# Patient Record
Sex: Female | Born: 1935 | Race: White | Hispanic: No | Marital: Married
Health system: Southern US, Community
[De-identification: ages and names within clinical notes are randomized; demographics above are authoritative.]

---

## 2010-10-16 ENCOUNTER — Emergency Department: Payer: Self-pay | Admitting: Emergency Medicine

## 2013-09-20 ENCOUNTER — Emergency Department: Payer: Self-pay | Admitting: Emergency Medicine

## 2013-09-20 LAB — URINALYSIS, COMPLETE
Nitrite: NEGATIVE
Ph: 5 (ref 4.5–8.0)
Protein: NEGATIVE
Specific Gravity: 1.008 (ref 1.003–1.030)
WBC UR: 79 /HPF (ref 0–5)

## 2013-09-20 LAB — CBC
MCH: 31.8 pg (ref 26.0–34.0)
Platelet: 207 10*3/uL (ref 150–440)
WBC: 12.4 10*3/uL — ABNORMAL HIGH (ref 3.6–11.0)

## 2013-09-20 LAB — BASIC METABOLIC PANEL
BUN: 20 mg/dL — ABNORMAL HIGH (ref 7–18)
Calcium, Total: 9.5 mg/dL (ref 8.5–10.1)
Creatinine: 1.04 mg/dL (ref 0.60–1.30)
Glucose: 108 mg/dL — ABNORMAL HIGH (ref 65–99)
Potassium: 3.8 mmol/L (ref 3.5–5.1)
Sodium: 137 mmol/L (ref 136–145)

## 2013-09-20 LAB — PROTIME-INR: Prothrombin Time: 20.2 secs — ABNORMAL HIGH (ref 11.5–14.7)

## 2013-09-21 LAB — URINE CULTURE

## 2014-09-01 ENCOUNTER — Inpatient Hospital Stay: Payer: Self-pay | Admitting: Internal Medicine

## 2014-09-01 LAB — COMPREHENSIVE METABOLIC PANEL
ALK PHOS: 54 U/L
AST: 29 U/L (ref 15–37)
Albumin: 3.4 g/dL (ref 3.4–5.0)
Anion Gap: 10 (ref 7–16)
BUN: 27 mg/dL — AB (ref 7–18)
Bilirubin,Total: 0.5 mg/dL (ref 0.2–1.0)
CO2: 23 mmol/L (ref 21–32)
Calcium, Total: 8.5 mg/dL (ref 8.5–10.1)
Chloride: 107 mmol/L (ref 98–107)
Creatinine: 1.04 mg/dL (ref 0.60–1.30)
EGFR (Non-African Amer.): 48 — ABNORMAL LOW
GFR CALC AF AMER: 56 — AB
GLUCOSE: 112 mg/dL — AB (ref 65–99)
OSMOLALITY: 285 (ref 275–301)
Potassium: 3.9 mmol/L (ref 3.5–5.1)
SGPT (ALT): 18 U/L
Sodium: 140 mmol/L (ref 136–145)
Total Protein: 7.1 g/dL (ref 6.4–8.2)

## 2014-09-01 LAB — CBC
HCT: 41.3 % (ref 35.0–47.0)
HGB: 13.3 g/dL (ref 12.0–16.0)
MCH: 31 pg (ref 26.0–34.0)
MCHC: 32.3 g/dL (ref 32.0–36.0)
MCV: 96 fL (ref 80–100)
PLATELETS: 183 10*3/uL (ref 150–440)
RBC: 4.31 10*6/uL (ref 3.80–5.20)
RDW: 14.2 % (ref 11.5–14.5)
WBC: 8 10*3/uL (ref 3.6–11.0)

## 2014-09-01 LAB — PROTIME-INR
INR: 2.4
INR: 2.1
Prothrombin Time: 25.9 secs — ABNORMAL HIGH (ref 11.5–14.7)
Prothrombin Time: 23.1 secs — ABNORMAL HIGH (ref 11.5–14.7)

## 2014-09-01 LAB — HEMOGLOBIN
HGB: 11.7 g/dL — ABNORMAL LOW (ref 12.0–16.0)
HGB: 11.4 g/dL — AB (ref 12.0–16.0)
HGB: 11.4 g/dL — ABNORMAL LOW (ref 12.0–16.0)

## 2014-09-02 LAB — BASIC METABOLIC PANEL
ANION GAP: 6 — AB (ref 7–16)
BUN: 13 mg/dL (ref 7–18)
CO2: 27 mmol/L (ref 21–32)
CREATININE: 0.76 mg/dL (ref 0.60–1.30)
Calcium, Total: 8.4 mg/dL — ABNORMAL LOW (ref 8.5–10.1)
Chloride: 110 mmol/L — ABNORMAL HIGH (ref 98–107)
EGFR (Non-African Amer.): >60
Glucose: 101 mg/dL — ABNORMAL HIGH (ref 65–99)
Osmolality: 285 (ref 275–301)
Potassium: 3.2 mmol/L — ABNORMAL LOW (ref 3.5–5.1)
Sodium: 143 mmol/L (ref 136–145)

## 2014-09-02 LAB — CBC WITH DIFFERENTIAL/PLATELET
Basophil #: 0 10*3/uL (ref 0.0–0.1)
Basophil %: 0.7 %
EOS ABS: 0.1 10*3/uL (ref 0.0–0.7)
EOS PCT: 1.7 %
HCT: 34.3 % — ABNORMAL LOW (ref 35.0–47.0)
HGB: 11.4 g/dL — ABNORMAL LOW (ref 12.0–16.0)
LYMPHS ABS: 1.4 10*3/uL (ref 1.0–3.6)
LYMPHS PCT: 22.7 %
MCH: 31.7 pg (ref 26.0–34.0)
MCHC: 33.2 g/dL (ref 32.0–36.0)
MCV: 95 fL (ref 80–100)
Monocyte #: 0.7 x10 3/mm (ref 0.2–0.9)
Monocyte %: 11.2 %
Neutrophil #: 3.9 10*3/uL (ref 1.4–6.5)
Neutrophil %: 63.7 %
PLATELETS: 160 10*3/uL (ref 150–440)
RBC: 3.59 10*6/uL — AB (ref 3.80–5.20)
RDW: 14.5 % (ref 11.5–14.5)
WBC: 6.2 10*3/uL (ref 3.6–11.0)

## 2014-09-02 LAB — PROTIME-INR
INR: 1.3
Prothrombin Time: 15.7 secs — ABNORMAL HIGH (ref 11.5–14.7)

## 2014-09-02 LAB — MAGNESIUM: Magnesium: 1.5 mg/dL — ABNORMAL LOW

## 2014-09-03 LAB — CBC WITH DIFFERENTIAL/PLATELET
Basophil #: 0.1 10*3/uL (ref 0.0–0.1)
Basophil %: 0.7 %
EOS ABS: 0.1 10*3/uL (ref 0.0–0.7)
EOS PCT: 1.2 %
HCT: 37.5 % (ref 35.0–47.0)
HGB: 12.5 g/dL (ref 12.0–16.0)
Lymphocyte #: 1.9 10*3/uL (ref 1.0–3.6)
Lymphocyte %: 23 %
MCH: 32.2 pg (ref 26.0–34.0)
MCHC: 33.5 g/dL (ref 32.0–36.0)
MCV: 96 fL (ref 80–100)
MONO ABS: 1.1 x10 3/mm — AB (ref 0.2–0.9)
MONOS PCT: 12.5 %
NEUTROS PCT: 62.6 %
Neutrophil #: 5.3 10*3/uL (ref 1.4–6.5)
Platelet: 183 10*3/uL (ref 150–440)
RBC: 3.89 10*6/uL (ref 3.80–5.20)
RDW: 14.1 % (ref 11.5–14.5)
WBC: 8.4 10*3/uL (ref 3.6–11.0)

## 2014-09-03 LAB — BASIC METABOLIC PANEL
ANION GAP: 10 (ref 7–16)
BUN: 8 mg/dL (ref 7–18)
CALCIUM: 8.5 mg/dL (ref 8.5–10.1)
Chloride: 107 mmol/L (ref 98–107)
Co2: 26 mmol/L (ref 21–32)
Creatinine: 0.77 mg/dL (ref 0.60–1.30)
EGFR (African American): >60
EGFR (Non-African Amer.): >60
GLUCOSE: 98 mg/dL (ref 65–99)
OSMOLALITY: 283 (ref 275–301)
POTASSIUM: 3.7 mmol/L (ref 3.5–5.1)
Sodium: 143 mmol/L (ref 136–145)

## 2014-09-04 LAB — CBC WITH DIFFERENTIAL/PLATELET
Basophil #: 0 10*3/uL (ref 0.0–0.1)
Basophil %: 0.3 %
EOS PCT: 0.3 %
Eosinophil #: 0 10*3/uL (ref 0.0–0.7)
HCT: 35.9 % (ref 35.0–47.0)
HGB: 11.6 g/dL — ABNORMAL LOW (ref 12.0–16.0)
LYMPHS ABS: 2.3 10*3/uL (ref 1.0–3.6)
Lymphocyte %: 17.9 %
MCH: 31 pg (ref 26.0–34.0)
MCHC: 32.4 g/dL (ref 32.0–36.0)
MCV: 96 fL (ref 80–100)
MONOS PCT: 12.6 %
Monocyte #: 1.6 x10 3/mm — ABNORMAL HIGH (ref 0.2–0.9)
NEUTROS PCT: 68.9 %
Neutrophil #: 8.8 10*3/uL — ABNORMAL HIGH (ref 1.4–6.5)
Platelet: 185 10*3/uL (ref 150–440)
RBC: 3.74 10*6/uL — ABNORMAL LOW (ref 3.80–5.20)
RDW: 14 % (ref 11.5–14.5)
WBC: 12.8 10*3/uL — ABNORMAL HIGH (ref 3.6–11.0)

## 2014-09-05 LAB — CBC WITH DIFFERENTIAL/PLATELET
BASOS ABS: 0.1 10*3/uL (ref 0.0–0.1)
Basophil %: 0.8 %
EOS PCT: 0.8 %
Eosinophil #: 0.1 10*3/uL (ref 0.0–0.7)
HCT: 38 % (ref 35.0–47.0)
HGB: 12.3 g/dL (ref 12.0–16.0)
LYMPHS ABS: 2.4 10*3/uL (ref 1.0–3.6)
Lymphocyte %: 18.7 %
MCH: 31.4 pg (ref 26.0–34.0)
MCHC: 32.5 g/dL (ref 32.0–36.0)
MCV: 97 fL (ref 80–100)
MONOS PCT: 13.2 %
Monocyte #: 1.7 x10 3/mm — ABNORMAL HIGH (ref 0.2–0.9)
NEUTROS PCT: 66.5 %
Neutrophil #: 8.7 10*3/uL — ABNORMAL HIGH (ref 1.4–6.5)
PLATELETS: 180 10*3/uL (ref 150–440)
RBC: 3.94 10*6/uL (ref 3.80–5.20)
RDW: 14.3 % (ref 11.5–14.5)
WBC: 13.1 10*3/uL — AB (ref 3.6–11.0)

## 2014-09-11 ENCOUNTER — Encounter: Payer: Self-pay | Admitting: Internal Medicine

## 2014-09-11 LAB — URINALYSIS, COMPLETE
Bilirubin,UR: NEGATIVE
Glucose,UR: NEGATIVE mg/dL (ref 0–75)
KETONE: NEGATIVE
NITRITE: NEGATIVE
PH: 6 (ref 4.5–8.0)
PROTEIN: NEGATIVE
RBC,UR: 5 /HPF (ref 0–5)
Specific Gravity: 1.008 (ref 1.003–1.030)
Squamous Epithelial: 40
WBC UR: 23 /HPF (ref 0–5)

## 2014-09-12 ENCOUNTER — Emergency Department (HOSPITAL_COMMUNITY): Payer: Medicare Other

## 2014-09-12 ENCOUNTER — Inpatient Hospital Stay (HOSPITAL_COMMUNITY)
Admission: EM | Admit: 2014-09-12 | Discharge: 2014-09-20 | DRG: 064 | Disposition: E | Payer: Medicare Other | Attending: Internal Medicine | Admitting: Internal Medicine

## 2014-09-12 DIAGNOSIS — Z66 Do not resuscitate: Secondary | ICD-10-CM | POA: Diagnosis not present

## 2014-09-12 DIAGNOSIS — I4891 Unspecified atrial fibrillation: Secondary | ICD-10-CM | POA: Diagnosis present

## 2014-09-12 DIAGNOSIS — I959 Hypotension, unspecified: Secondary | ICD-10-CM | POA: Diagnosis not present

## 2014-09-12 DIAGNOSIS — J9600 Acute respiratory failure, unspecified whether with hypoxia or hypercapnia: Secondary | ICD-10-CM | POA: Diagnosis present

## 2014-09-12 DIAGNOSIS — E785 Hyperlipidemia, unspecified: Secondary | ICD-10-CM | POA: Diagnosis present

## 2014-09-12 DIAGNOSIS — I6330 Cerebral infarction due to thrombosis of unspecified cerebral artery: Secondary | ICD-10-CM | POA: Diagnosis present

## 2014-09-12 DIAGNOSIS — I10 Essential (primary) hypertension: Secondary | ICD-10-CM | POA: Diagnosis present

## 2014-09-12 DIAGNOSIS — Z79899 Other long term (current) drug therapy: Secondary | ICD-10-CM | POA: Diagnosis not present

## 2014-09-12 DIAGNOSIS — R4182 Altered mental status, unspecified: Secondary | ICD-10-CM | POA: Diagnosis present

## 2014-09-12 DIAGNOSIS — I63511 Cerebral infarction due to unspecified occlusion or stenosis of right middle cerebral artery: Secondary | ICD-10-CM

## 2014-09-12 DIAGNOSIS — Z515 Encounter for palliative care: Secondary | ICD-10-CM | POA: Diagnosis not present

## 2014-09-12 DIAGNOSIS — I6350 Cerebral infarction due to unspecified occlusion or stenosis of unspecified cerebral artery: Secondary | ICD-10-CM | POA: Diagnosis present

## 2014-09-12 DIAGNOSIS — I639 Cerebral infarction, unspecified: Secondary | ICD-10-CM | POA: Diagnosis present

## 2014-09-12 LAB — DIFFERENTIAL
BASOS ABS: 0 10*3/uL (ref 0.0–0.1)
BASOS PCT: 0 % (ref 0–1)
EOS ABS: 0.1 10*3/uL (ref 0.0–0.7)
Eosinophils Relative: 1 % (ref 0–5)
Lymphocytes Relative: 22 % (ref 12–46)
Lymphs Abs: 2.7 10*3/uL (ref 0.7–4.0)
MONOS PCT: 10 % (ref 3–12)
Monocytes Absolute: 1.3 10*3/uL — ABNORMAL HIGH (ref 0.1–1.0)
NEUTROS ABS: 8.3 10*3/uL — AB (ref 1.7–7.7)
NEUTROS PCT: 67 % (ref 43–77)

## 2014-09-12 LAB — I-STAT CHEM 8, ED
BUN: 19 mg/dL (ref 6–23)
CALCIUM ION: 1.06 mmol/L — AB (ref 1.13–1.30)
Chloride: 94 mEq/L — ABNORMAL LOW (ref 96–112)
Creatinine, Ser: 0.8 mg/dL (ref 0.50–1.10)
Glucose, Bld: 159 mg/dL — ABNORMAL HIGH (ref 70–99)
HEMATOCRIT: 42 % (ref 36.0–46.0)
Hemoglobin: 14.3 g/dL (ref 12.0–15.0)
POTASSIUM: 4.1 meq/L (ref 3.7–5.3)
SODIUM: 133 meq/L — AB (ref 137–147)
TCO2: 27 mmol/L (ref 0–100)

## 2014-09-12 LAB — CBC
HCT: 38.7 % (ref 36.0–46.0)
HEMOGLOBIN: 12.8 g/dL (ref 12.0–15.0)
MCH: 31 pg (ref 26.0–34.0)
MCHC: 33.1 g/dL (ref 30.0–36.0)
MCV: 93.7 fL (ref 78.0–100.0)
Platelets: 390 10*3/uL (ref 150–400)
RBC: 4.13 MIL/uL (ref 3.87–5.11)
RDW: 13.6 % (ref 11.5–15.5)
WBC: 12.4 10*3/uL — ABNORMAL HIGH (ref 4.0–10.5)

## 2014-09-12 LAB — COMPREHENSIVE METABOLIC PANEL
ALBUMIN: 3.3 g/dL — AB (ref 3.5–5.2)
ALK PHOS: 85 U/L (ref 39–117)
ALT: 34 U/L (ref 0–35)
ANION GAP: 18 — AB (ref 5–15)
AST: 37 U/L (ref 0–37)
BUN: 19 mg/dL (ref 6–23)
CHLORIDE: 91 meq/L — AB (ref 96–112)
CO2: 26 mEq/L (ref 19–32)
CREATININE: 0.68 mg/dL (ref 0.50–1.10)
Calcium: 9.3 mg/dL (ref 8.4–10.5)
GFR calc Af Amer: 89 mL/min — ABNORMAL LOW (ref >90)
GFR calc non Af Amer: 76 mL/min — ABNORMAL LOW (ref >90)
Glucose, Bld: 160 mg/dL — ABNORMAL HIGH (ref 70–99)
POTASSIUM: 4.2 meq/L (ref 3.7–5.3)
Sodium: 135 mEq/L — ABNORMAL LOW (ref 137–147)
Total Bilirubin: 0.6 mg/dL (ref 0.3–1.2)
Total Protein: 7.7 g/dL (ref 6.0–8.3)

## 2014-09-12 LAB — URINALYSIS, ROUTINE W REFLEX MICROSCOPIC
Bilirubin Urine: NEGATIVE
GLUCOSE, UA: NEGATIVE mg/dL
KETONES UR: NEGATIVE mg/dL
Leukocytes, UA: NEGATIVE
Nitrite: NEGATIVE
Protein, ur: 100 mg/dL — AB
Specific Gravity, Urine: 1.021 (ref 1.005–1.030)
UROBILINOGEN UA: 1 mg/dL (ref 0.0–1.0)
pH: 5 (ref 5.0–8.0)

## 2014-09-12 LAB — CBG MONITORING, ED: Glucose-Capillary: 169 mg/dL — ABNORMAL HIGH (ref 70–99)

## 2014-09-12 LAB — I-STAT ARTERIAL BLOOD GAS, ED
Acid-Base Excess: 4 mmol/L — ABNORMAL HIGH (ref 0.0–2.0)
Bicarbonate: 32.1 mEq/L — ABNORMAL HIGH (ref 20.0–24.0)
O2 Saturation: 100 %
PH ART: 7.303 — AB (ref 7.350–7.450)
TCO2: 34 mmol/L (ref 0–100)
pCO2 arterial: 64.8 mmHg (ref 35.0–45.0)
pO2, Arterial: 348 mmHg — ABNORMAL HIGH (ref 80.0–100.0)

## 2014-09-12 LAB — URINE MICROSCOPIC-ADD ON

## 2014-09-12 LAB — I-STAT TROPONIN, ED: Troponin i, poc: 0.03 ng/mL (ref 0.00–0.08)

## 2014-09-12 LAB — RAPID URINE DRUG SCREEN, HOSP PERFORMED
Amphetamines: NOT DETECTED
BENZODIAZEPINES: NOT DETECTED
Barbiturates: NOT DETECTED
Cocaine: NOT DETECTED
OPIATES: NOT DETECTED
TETRAHYDROCANNABINOL: NOT DETECTED

## 2014-09-12 LAB — PROTIME-INR
INR: 1.02 (ref 0.00–1.49)
PROTHROMBIN TIME: 13.4 s (ref 11.6–15.2)

## 2014-09-12 LAB — ETHANOL

## 2014-09-12 LAB — APTT: APTT: 29 s (ref 24–37)

## 2014-09-12 MED ORDER — MORPHINE SULFATE 10 MG/ML IJ SOLN
10.0000 mg/h | INTRAVENOUS | Status: DC
  Administered 2014-09-12 (×2): 10 mg/h via INTRAVENOUS
  Filled 2014-09-12 (×3): qty 10

## 2014-09-12 MED ORDER — SODIUM CHLORIDE 0.9 % IV SOLN
100.0000 ug/h | INTRAVENOUS | Status: DC
  Administered 2014-09-12: 100 ug/h via INTRAVENOUS
  Filled 2014-09-12: qty 50

## 2014-09-12 MED ORDER — MORPHINE BOLUS VIA INFUSION
2.0000 mg | INTRAVENOUS | Status: DC | PRN
  Administered 2014-09-12: 2 mg via INTRAVENOUS
  Filled 2014-09-12: qty 2

## 2014-09-12 MED ORDER — SODIUM CHLORIDE 0.9 % IV SOLN
2.0000 mg/h | INTRAVENOUS | Status: DC
  Filled 2014-09-12 (×2): qty 10

## 2014-09-12 MED ORDER — MORPHINE SULFATE 2 MG/ML IJ SOLN: 1.0000 mg | INTRAMUSCULAR | Status: DC | PRN

## 2014-09-12 MED ORDER — NICARDIPINE HCL IN NACL 20-0.86 MG/200ML-% IV SOLN
3.0000 mg/h | Freq: Once | INTRAVENOUS | Status: DC
Start: 2014-09-12 — End: 2014-09-12
  Filled 2014-09-12: qty 200

## 2014-09-12 MED ORDER — HALOPERIDOL LACTATE 2 MG/ML PO CONC
0.5000 mg | Freq: Four times a day (QID) | ORAL | Status: DC | PRN
  Filled 2014-09-12: qty 0.3

## 2014-09-12 MED ORDER — ATROPINE SULFATE 1 % OP SOLN
2.0000 [drp] | Freq: Three times a day (TID) | OPHTHALMIC | Status: DC
  Administered 2014-09-12: 2 [drp] via SUBLINGUAL
  Filled 2014-09-12: qty 2

## 2014-09-12 MED ORDER — ROCURONIUM BROMIDE 50 MG/5ML IV SOLN
INTRAVENOUS | Status: AC | PRN
  Administered 2014-09-12: 90 mg via INTRAVENOUS

## 2014-09-12 MED ORDER — ETOMIDATE 2 MG/ML IV SOLN
INTRAVENOUS | Status: AC | PRN
  Administered 2014-09-12: 20 mg via INTRAVENOUS

## 2014-09-12 MED ORDER — DILTIAZEM HCL 25 MG/5ML IV SOLN
10.0000 mg | Freq: Once | INTRAVENOUS | Status: AC
  Administered 2014-09-12: 10 mg via INTRAVENOUS
  Filled 2014-09-12: qty 5

## 2014-09-12 MED ORDER — LORAZEPAM 2 MG/ML PO CONC
1.0000 mg | ORAL | Status: DC | PRN
  Administered 2014-09-12: 1 mg via SUBLINGUAL
  Filled 2014-09-12: qty 0.5

## 2014-09-12 MED ORDER — ATROPINE SULFATE 1 % OP SOLN
2.0000 [drp] | OPHTHALMIC | Status: DC | PRN
  Filled 2014-09-12: qty 2

## 2014-09-12 MED ORDER — MORPHINE SULFATE 2 MG/ML IJ SOLN
1.0000 mg | INTRAMUSCULAR | Status: DC | PRN
  Administered 2014-09-12: 2 mg via INTRAVENOUS
  Filled 2014-09-12: qty 1

## 2014-09-12 MED ORDER — MORPHINE BOLUS VIA INFUSION
5.0000 mg | INTRAVENOUS | Status: DC | PRN
  Administered 2014-09-12: 15 mg via INTRAVENOUS
  Administered 2014-09-12: 20 mg via INTRAVENOUS
  Filled 2014-09-12: qty 20

## 2014-09-13 LAB — URINE CULTURE

## 2014-09-13 NOTE — Progress Notes (Signed)
UR complete.  Nevah Dalal RN, MSN 

## 2014-09-16 ENCOUNTER — Telehealth: Payer: Self-pay | Admitting: Internal Medicine

## 2014-09-16 NOTE — Discharge Summary (Signed)
  Name: Jillian Kennedy MRN: [id number] DOB: [birth year] 80 y.o.  Date of Admission: Oct 09, 2014  5:53 AM Date of Discharge: 09/16/2014 Attending Physician: No att. providers found  Discharge Diagnosis: Active Problems:   Unspecified cerebral artery occlusion with cerebral infarction   Acute respiratory failure   Atrial fibrillation   Hypertension   CVA (cerebral infarction)  Cause of death: MCA stroke Time of death: 4:55 PM  Disposition and follow-up:   Jillian Kennedy was discharged from [facility] in expired condition.    Hospital Course: Patient was admitted from nursing home with signs of stroke (confusion, nonsensical speech, left facial droop, left sided weakness) one day prior. Her coumadin had recently been held in the setting of a GI bleed 2/2 diverticulosis.   Neurology evaluated the patient. She was found to have a right MCA acute ischemic stroke (right M1 thrombus on CT and visible infarct in posterior right MCA territory, inferior division with extension into the inferior right occipital lobe, likely posterior watershed). She was outside the window for TPA.   The patient was initially intubated in the ED, as she could not protect her airway and pulmonary/critical care was consulted. However, after discussion with her family members (who had not been present during time of intubation), her code status was changed to DNR. Subsequently, the patient was extubated with the plan for comfort care.   Palliative care was also consulted. Morphine infusion-titrate for comfort and bolus doses was initiated, discontinued O2 as death was imminent and it was not contributing to her comfort, ativan was given for agitation, sublingual atropine drops were given for upper airway secretions. The chaplain visited the family.   The patient passed comfortably with family members and MDs in the room.  Signed: Dionne Ano, MD 09/16/2014, 9:53 AM

## 2014-09-20 ENCOUNTER — Encounter: Payer: Self-pay | Admitting: Internal Medicine

## 2014-09-20 NOTE — Progress Notes (Signed)
80 y.o from SNF, LSN 9/22. Per SNF staff pt had severe weakness 9/22 and report her behavior as different. No family at bedside, message left per Dr. Amada Jupiter. Pt brought in per EMS for severe weakness on lefts side, left facial droop, unintelligible speech, right gaze preference. NIHSS done yielding 29, see flowsheet for specific assessment. CBG 169. CT scan done, negative. Pt stuporous, unable to maintain her airway. Intubated per EDP at bedside. Pt out of window for acute treatment at this time. Once stable to be admitted per CCM.

## 2014-09-20 NOTE — ED Notes (Signed)
Family at bedside. 

## 2014-09-20 NOTE — Progress Notes (Signed)
Vent removed from room at this time by RT, RN at the bedside speaking with family.

## 2014-09-20 NOTE — Progress Notes (Signed)
Chaplain met with family and patient upon being paged. Pt grandson Ovid Curd, was visibly emotional and has been her primary caregiver the last three years. Chaplain offered hospitality, prayer, and grief support. Page on-call chaplain as needed.   September 25, 2014 1000  Clinical Encounter Type  Visited With Patient and family together;Health care provider  Visit Type Initial;Spiritual support  Referral From Nurse  Spiritual Encounters  Spiritual Needs Emotional;Grief support;Prayer  Stress Factors  Family Stress Factors Loss;Major life changes;Family relationships  Stamey, Barbette Hair. 10:56 AM 09/25/2014

## 2014-09-20 NOTE — ED Provider Notes (Signed)
CSN: [id number]     Arrival date & time 09/17/2014  0553 History   First MD Initiated Contact with Patient September 17, 2014 0556     No chief complaint on file.    (Consider location/radiation/quality/duration/timing/severity/associated sxs/prior Treatment) HPI  Ms. Knapper is an 80yo female with unknown history, coming from nursing home with concerns for stroke. Per EMS the patient was last seen normal at 3 AM. At that time the nursing home staff witnessed the patient having slurred speech, facial droop, and left-sided hemi-neglect. They deny any recent infections or antibiotic use. Patient is currently denying any pain. Her speech is slurred it is difficult to obtain a full history from her.   No past medical history on file. No past surgical history on file. No family history on file. History  Substance Use Topics  . Smoking status: Not on file  . Smokeless tobacco: Not on file  . Alcohol Use: Not on file   OB History   No data available     Review of Systems  Unable to perform ROS: Mental status change      Allergies  Review of patient's allergies indicates not on file.  Home Medications   Prior to Admission medications   Not on File   There were no vitals taken for this visit. Physical Exam  Nursing note and vitals reviewed. Constitutional: She appears well-developed and well-nourished. No distress.  HENT:  Head: Normocephalic and atraumatic.  Eyes: Conjunctivae and EOM are normal. Pupils are equal, round, and reactive to light. No scleral icterus.  Neck: Normal range of motion. Neck supple. No JVD present. No tracheal deviation present. No thyromegaly present.  Cardiovascular: Normal rate, regular rhythm and normal heart sounds.  Exam reveals no gallop and no friction rub.   No murmur heard. Pulmonary/Chest: Effort normal and breath sounds normal. No respiratory distress. She has no wheezes. She exhibits no tenderness.  Abdominal: Soft. Bowel sounds are normal. She  exhibits no distension and no mass. There is no tenderness. There is no rebound and no guarding.  Musculoskeletal: Normal range of motion. She exhibits no edema and no tenderness.  Lymphadenopathy:    She has no cervical adenopathy.  Neurological:  Right-sided facial droop. Right-sided gaze. Left-sided upper and lower extremity weakness. Patient can follow commands on the right side. Patient's withdrawals to pain bilaterally. Cerebellar testing was not performed due to the acuity of condition.  Skin: Skin is warm and dry. No rash noted. She is not diaphoretic. No erythema. No pallor.    ED Course  Procedures (including critical care time) Labs Review Labs Reviewed  CBC - Abnormal; Notable for the following:    WBC 12.4 (*)    All other components within normal limits  DIFFERENTIAL - Abnormal; Notable for the following:    Neutro Abs 8.3 (*)    Monocytes Absolute 1.3 (*)    All other components within normal limits  COMPREHENSIVE METABOLIC PANEL - Abnormal; Notable for the following:    Sodium 135 (*)    Chloride 91 (*)    Glucose, Bld 160 (*)    Albumin 3.3 (*)    GFR calc non Af Amer 76 (*)    GFR calc Af Amer 89 (*)    Anion gap 18 (*)    All other components within normal limits  I-STAT CHEM 8, ED - Abnormal; Notable for the following:    Sodium 133 (*)    Chloride 94 (*)    Glucose, Bld 159 (*)  Calcium, Ion 1.06 (*)    All other components within normal limits  CBG MONITORING, ED - Abnormal; Notable for the following:    Glucose-Capillary 169 (*)    All other components within normal limits  I-STAT ARTERIAL BLOOD GAS, ED - Abnormal; Notable for the following:    pH, Arterial 7.303 (*)    pCO2 arterial 64.8 (*)    pO2, Arterial 348.0 (*)    Bicarbonate 32.1 (*)    Acid-Base Excess 4.0 (*)    All other components within normal limits  ETHANOL  PROTIME-INR  APTT  URINE RAPID DRUG SCREEN (HOSP PERFORMED)  URINALYSIS, ROUTINE W REFLEX MICROSCOPIC  BLOOD GAS,  ARTERIAL  I-STAT TROPOININ, ED  I-STAT TROPOININ, ED    Imaging Review Ct Head Wo Contrast  20-Sep-2014   CLINICAL DATA:  Code stroke. Left-sided weakness and facial droop. Slurred speech.  EXAM: CT HEAD WITHOUT CONTRAST  TECHNIQUE: Contiguous axial images were obtained from the base of the skull through the vertex without intravenous contrast.  COMPARISON:  None.  FINDINGS: No acute osseous findings. There is a small incidental osteoma from the central frontal bone. Dermal inclusion cyst in the high right frontal scalp.  There is cytotoxic edema involving the right temporal operculum, posterior temporal lobe, and extending towards the inferior right occipital lobe. The visible infarct may not be third of the territory, but is still extensive. Abnormal high attenuation in the region of the right M1 segment. There may be blurring of the upper right putamen. No evidence of hemorrhage.  Mild periventricular white matter disease.  Cerebral volume loss which is age appropriate.  Critical Value/emergent results were called by telephone at the time of interpretation on 09-20-2014 at 6:23 am to Dr. Tomasita Crumble , who verbally acknowledged these results.  IMPRESSION: 1. Probable right M1 thrombus. 2. Visible infarct in posterior right MCA territory, inferior division. There is extension into the inferior right occipital lobe, likely posterior watershed.   Electronically Signed   By: Tiburcio Pea M.D.   On: 2014/09/20 06:26   Dg Chest Portable 1 View  September 20, 2014   CLINICAL DATA:  Hypoxia  EXAM: PORTABLE CHEST - 1 VIEW  COMPARISON:  September 04, 2014  FINDINGS: Endotracheal tube tip is 5.2 cm above the carina. No pneumothorax. There is mild interstitial edema. There is no airspace consolidation. Heart is mildly enlarged. The pulmonary vascularity is within normal limits. No adenopathy. There is atherosclerotic change in aorta. No bone lesions.  IMPRESSION: Endotracheal tube as described without pneumothorax.  Cardiomegaly with mild interstitial edema. Suspect a degree of congestive heart failure. No airspace consolidation.   Electronically Signed   By: Bretta Bang M.D.   On: September 20, 2014 07:03     EKG Interpretation None      MDM   Final diagnoses:  None    Patient's this emergency department out of concern for a stroke. She was immediately taken to the CT scan which revealed a right M1 thrombus. A code stroke was called, Dr. Amada Jupiter evaluated the patient at the bedside. CT is already showing significant ischemic changes. After calling the nursing home back it was discovered that her symptoms actually began during the day yesterday the patient is outside the window. Dr. Amada Jupiter does not feel any intervention can be done and recommends admission to the medical ICU. Upon returning from the CT scan patient's mental status continued to deteriorate and she was intubated for airway protection.  Subsequently patient's heart rate began to rise to the  150s systolic. She is on home diltiazem and she was given 10 IV. Upon repeat assessment her heart rate fell down to the 110s to 120s.  She was given a liter of IV fluids as well. Nicardipine gtt was ordered at the bedside as the patient was having systolic blood pressures in the 210s. Be held as well as her blood pressure remained below 180 systolic. Patient be retained in the intensive care unit for continued care.  INTUBATION Performed by: Tomasita Crumble  Required items: required blood products, implants, devices, and special equipment available Patient identity confirmed: provided demographic data and hospital-assigned identification number Time out: Immediately prior to procedure a "time out" was called to verify the correct patient, procedure, equipment, support staff and site/side marked as required.  Indications: Airway protection Intubation method: Direct Laryngoscopy   Preoxygenation: 100% BVM  Sedatives: Etomidate Paralytic:  Rocuronium   Tube Size: 8-0 cuffed  Post-procedure assessment: chest rise and ETCO2 monitor Breath sounds: equal and absent over the epigastrium Tube secured with: ETT holder Chest x-ray interpreted by radiologist and me.  Chest x-ray findings: endotracheal tube in appropriate position  Patient tolerated the procedure well with no immediate complications.  CRITICAL CARE Performed by: Tomasita Crumble   Total critical care time: 40 minutes  Critical care time was exclusive of separately billable procedures and treating other patients.  Critical care was necessary to treat or prevent imminent or life-threatening deterioration.  Critical care was time spent personally by me on the following activities: development of treatment plan with patient and/or surrogate as well as nursing, discussions with consultants, evaluation of patient's response to treatment, examination of patient, obtaining history from patient or surrogate, ordering and performing treatments and interventions, ordering and review of laboratory studies, ordering and review of radiographic studies, pulse oximetry and re-evaluation of patient's condition.       Tomasita Crumble, MD 10/02/14 [phone]

## 2014-09-20 NOTE — ED Notes (Signed)
Pt extubated at this time. Daughter remained at the bedside. Pt moaning. Suctioned oral cavity.

## 2014-09-20 NOTE — ED Notes (Signed)
Dr. Oni back at the bedside. 

## 2014-09-20 NOTE — ED Provider Notes (Signed)
0900:  PCCM Dr. Molli Knock has evaluated pt and spoken with multiple family members at the bedside: family states pt is DNR. PCCM will initiate withdrawal of care orders; requests to admit to medicine service. T/C to [facility] Resident, case discussed, including:  HPI, pertinent PM/SHx, VS/PE, dx testing, ED course and treatment:  Agreeable to admit, requests they will come to the ED for evaluation.   Samuel Jester, DO 2014/09/13 1011

## 2014-09-20 NOTE — ED Notes (Signed)
POA states he went to see her yesterday and was slurring her words.

## 2014-09-20 NOTE — Consult Note (Signed)
Neurology Consultation Reason for Consult: stroke Referring Physician: Mora Bellman, A  CC: Stroke  History is obtained from:mecdical record  HPI: Jillian Kennedy is a 80 y.o. female with a history of atrial fibrillation not on anti-coagulation due to GI bleed who presents with signs of a large right MCA infarct. She was confused throughout the day yesterday and apparently last night, her speech was nonsensical. The nurse describes that even during the day 9/22 she would reach for a cup and miss it. Overnight, she became progressively worse and her speech which was nonsense but not dysarthric initially became increasingly dysarthric sometime after 1 am.    LKW: 9/22 unclear time tpa given?: no, outside of window.    ROS: A 14 point ROS was performed and is negative except as noted in the HPI.   PMH: Afib GI bleeding Unable to obtain full due to decreased responsiveness.   Family History: Unable to assess secondary to patient's altered mental status.    Social History: Unable to assess secondary to patient's altered mental status.    Exam: Current vital signs: BP 214/99  Pulse 105  Temp(Src) 98.8 F (37.1 C) (Axillary)  Resp 15  SpO2 99% Vital signs in last 24 hours: Temp:  [98.8 F (37.1 C)] 98.8 F (37.1 C) (09/23 0609) Pulse Rate:  [56-137] 105 (09/23 0702) Resp:  [12-30] 15 (09/23 0702) BP: (138-214)/(91-155) 214/99 mmHg (09/23 0702) SpO2:  [97 %-100 %] 99 % (09/23 0702) FiO2 (%):  [100 %] 100 % (09/23 0642)  General: in bed, appears agitated CV: irregular, tachycardic Mental Status: Patient is awake but does not follow commands or answer questions. She has a clear left neglect. She is lethargic.  Cranial Nerves: II: Visual Fields are notable for right hemianopia. Pupils are equal, round, and reactive to light.  Discs are difficult to visualize. III,IV, VI: right gaze preference.  V: Facial sensation is decreased on left VII: Facial movement is left drrop VIII:  hearing is intact to voice X: Uvula elevates symmetrically XI: Shoulder shrug is symmetric. XII: tongue is midline without atrophy or fasciculations.  Motor: Tone is normal. Bulk is normal. 5/5 strength was present on the right, no movement in left arm, decreased movement left leg but does have some withdrawal Sensory: Sensation is decreased on the left.  Deep Tendon Reflexes: 2+ and symmetric in the biceps and patellae.  Cerebellar: Unable to assess secondary to patient's altered mental status.  Gait: Unable to assess secondary to patient's altered mental status.     I have reviewed labs in epic and the results pertinent to this consultation are: cmp- mildly low sodium  I have reviewed the images obtained:CT head - evolving right temporal infarct, likely left MCA occlusion  Impression: 80 yo F with LKW sometime yesterday. Her CT shows significant changes already, but I suspect that she has had either two emboli, or one embolus and then became relatively hypotensive overnight causing further ischemia from the initial lesion. She is outside of any intervention windows at this point and I have been unable to contact any family.   I am uncertain of handedness, but if she is left handed, then this could explain her language difficulty.   Recommendations: 1. HgbA1c, fasting lipid panel 2. MRI, MRA  of the brain without contrast 3. Frequent neuro checks 4. Echocardiogram 5. Carotid dopplers 6. Prophylactic therapy-Antiplatelet med: Aspirin - dose  PO or  PR 7. Risk factor modification 8. Telemetry monitoring 9. PT consult, OT consult, Speech consult  Roland Rack, MD [facility] [phone]  If 7pm- 7am, please page neurology on call as listed in [place].

## 2014-09-20 NOTE — Progress Notes (Signed)
Rate increased per abg results

## 2014-09-20 NOTE — Progress Notes (Signed)
Palliative Medicine Team  Called by RN re: uncontrolled symptoms at EOL. Primary team notified by RN but nurse received no additional orders and morphine infusion was discontinued. Nurse reports dyspnea and distress in the patient. PMT provider is not available to respond immediately and I have paged the primary service-awaiting call back. In the meantime, I have confirmed comfort is primary goal. Restarted morphine infusion-titrate for comfort and bolus doses ordered as well. Discontinued O2 at EOL since death is immanent and probably not contributing to her comfort. Ativan PRN for agitation and seizure prophylaxis in setting of stroke. Atropine SL drops for upper airway secretions.  Chaplain consult for family support.   Anderson Malta, DO Palliative Medicine [phone]

## 2014-09-20 NOTE — ED Notes (Signed)
Dr. Molli Knock at the bedside. Discussing with family pt's wishes.

## 2014-09-20 NOTE — Progress Notes (Signed)
Pt was pronounced dead by MD. Families were at the bedside and the patient was picked up by funeral home

## 2014-09-20 NOTE — ED Notes (Signed)
All family at the bedside. Pt waking up from fentanyl being stopped.

## 2014-09-20 NOTE — ED Notes (Signed)
MD at bedside. 

## 2014-09-20 NOTE — Consult Note (Signed)
Name: Jillian Kennedy MRN: [id number] DOB: 1925/11/30    ADMISSION DATE:  Sep 27, 2014 CONSULTATION DATE:  September 27, 2014  REFERRING MD :  EDP PRIMARY SERVICE:  TRH  CHIEF COMPLAINT:  CVA and VDRF  BRIEF PATIENT DESCRIPTION: 80 year old female with PMH of a-fib and HTN who started slurring her speech yesterday and was noted to have symptoms of CVA.  Has a living will that states DNR.  Was brought to ED and was intubated for airway protection.  PCCM called to admit.  Spoke with family, DNR noted.  SIGNIFICANT EVENTS / STUDIES:  9/23 VDRF due to inability to protect airway  LINES / TUBES: ETT 9/23>>>9/23  CULTURES: None  ANTIBIOTICS: None  PAST MEDICAL HISTORY :  No past medical history on file. No past surgical history on file. Prior to Admission medications   Medication Sig Start Date End Date Taking? Authorizing Provider  acetaminophen (TYLENOL) 325 MG tablet Take 650 mg by mouth every 6 (six) hours as needed for mild pain.   Yes Historical Provider, MD  acetaminophen (TYLENOL) 500 MG tablet Take 1,000 mg by mouth every 6 (six) hours as needed for mild pain.   Yes Historical Provider, MD  diltiazem (DILACOR XR) 120 MG 24 hr capsule Take 120 mg by mouth daily.   Yes Historical Provider, MD  furosemide (LASIX) 40 MG tablet Take 40 mg by mouth daily.   Yes Historical Provider, MD  Multiple Vitamin (TAB-A-VITE PO) Take 1 tablet by mouth daily.   Yes Historical Provider, MD  nystatin (MYCOSTATIN) powder Apply 1 g topically 2 (two) times daily.   Yes Historical Provider, MD  potassium chloride (KLOR-CON) 8 MEQ tablet Take 8 mEq by mouth daily.   Yes Historical Provider, MD  pravastatin (PRAVACHOL) 20 MG tablet Take 20 mg by mouth at bedtime.   Yes Historical Provider, MD   Allergies  Allergen Reactions  . Nsaids Other (See Comments)    History of GF bleed    FAMILY HISTORY:  No family history on file. SOCIAL HISTORY:  has no tobacco, alcohol, and drug history on file.  REVIEW  OF SYSTEMS:   Unattainable, patient is intubated with a large CVA  SUBJECTIVE: None  VITAL SIGNS: Temp:  [95 F (35 C)-99.9 F (37.7 C)] 99.7 F (37.6 C) (09/23 0845) Pulse Rate:  [55-175] 113 (09/23 0845) Resp:  [12-30] 20 (09/23 0845) BP: (138-214)/(60-155) 152/88 mmHg (09/23 0845) SpO2:  [97 %-100 %] 100 % (09/23 0845) FiO2 (%):  [100 %] 100 % (09/23 4098)  PHYSICAL EXAMINATION: General:  Chronically ill appearing elderly female Neuro:  Sedated and intubated HEENT:  [place]/AT, MMM Cardiovascular:  IRIR, Nl S1/S2, -M/R/G. Lungs:  Coarse BS diffusely. Abdomen:  Soft, NT, ND and +BS. Musculoskeletal:  -edema and -tenderness. Skin:  Intact   Recent Labs Lab 09/27/2014 0603 09-27-2014 0608  NA 135* 133*  K 4.2 4.1  CL 91* 94*  CO2 26  --   BUN 19 19  CREATININE 0.68 0.80  GLUCOSE 160* 159*    Recent Labs Lab 09-27-14 0603 2014/09/27 0608  HGB 12.8 14.3  HCT 38.7 42.0  WBC 12.4*  --   PLT 390  --    Ct Head Wo Contrast  Sep 27, 2014   CLINICAL DATA:  Code stroke. Left-sided weakness and facial droop. Slurred speech.  EXAM: CT HEAD WITHOUT CONTRAST  TECHNIQUE: Contiguous axial images were obtained from the base of the skull through the vertex without intravenous contrast.  COMPARISON:  None.  FINDINGS: No  acute osseous findings. There is a small incidental osteoma from the central frontal bone. Dermal inclusion cyst in the high right frontal scalp.  There is cytotoxic edema involving the right temporal operculum, posterior temporal lobe, and extending towards the inferior right occipital lobe. The visible infarct may not be third of the territory, but is still extensive. Abnormal high attenuation in the region of the right M1 segment. There may be blurring of the upper right putamen. No evidence of hemorrhage.  Mild periventricular white matter disease.  Cerebral volume loss which is age appropriate.  Critical Value/emergent results were called by telephone at the time of  interpretation on 09-18-2014 at 6:23 am to Dr. Tomasita Crumble , who verbally acknowledged these results.  IMPRESSION: 1. Probable right M1 thrombus. 2. Visible infarct in posterior right MCA territory, inferior division. There is extension into the inferior right occipital lobe, likely posterior watershed.   Electronically Signed   By: Tiburcio Pea M.D.   On: 09/18/2014 06:26   Dg Chest Portable 1 View  09-18-14   CLINICAL DATA:  Hypoxia  EXAM: PORTABLE CHEST - 1 VIEW  COMPARISON:  September 04, 2014  FINDINGS: Endotracheal tube tip is 5.2 cm above the carina. No pneumothorax. There is mild interstitial edema. There is no airspace consolidation. Heart is mildly enlarged. The pulmonary vascularity is within normal limits. No adenopathy. There is atherosclerotic change in aorta. No bone lesions.  IMPRESSION: Endotracheal tube as described without pneumothorax. Cardiomegaly with mild interstitial edema. Suspect a degree of congestive heart failure. No airspace consolidation.   Electronically Signed   By: Bretta Bang M.D.   On: 09-18-2014 07:03    ASSESSMENT / PLAN:  80 year old female with history of a-fib who presents with a massive CVA, intubated for airway protection, DNR, family waiting for arrival of grandson then will be ready to extubate.  Will make DNR and leave withdrawal orders in.  Once family arrive then extubate and TRH to admit to floor for palliation.  PCCM will sign off, please call back if needed.  CC time 35 min.  Alyson Reedy, M.D. [facility] Pulmonary/Critical Care Medicine. Pager: [phone]. After hours pager: [phone].  18-Sep-2014, 9:01 AM

## 2014-09-20 NOTE — ED Notes (Addendum)
Per EMS: pt coming from [facility] skilled nursing facility with c/o AMS since 0300 per staff. Pt presents with left sided deficits, non verbal, alert and disoriented x 4. CBG 167

## 2014-09-20 NOTE — ED Notes (Signed)
Oxygen at 2 liters [place] applied to pt for comfort.

## 2014-09-20 NOTE — Progress Notes (Signed)
Note plans for comfort care. Stroke Service will sign off. Please call for any needs.  Annie Main, MSN, RN, ANVP-BC, ANP-BC, [facility] Pager: [phone] 25-Sep-2014 11:38 AM

## 2014-09-20 NOTE — ED Notes (Signed)
hospitalist at the bedside 

## 2014-09-20 NOTE — H&P (Signed)
Date: 2014-09-14               Patient Name:  Jillian Kennedy MRN: [id number]  DOB: [birth year] Age / Sex: 80 y.o., female   PCP: Lauro Regulus, MD              Medical Service: Internal Medicine Teaching Service              Attending Physician: Dr. Tomasita Crumble, MD    First Contact: Dr. Leatha Gilding  Pager: [phone]  Second Contact: Dr. Sherrine Maples Pager: [phone]            After Hours (After 5p/  First Contact Pager: [phone]  weekends / holidays): Second Contact Pager: [phone]   Chief Complaint:  Acute stroke   History of Present Illness: this is an 80 year old woman, with past medical history of atrial fibrillation, recently off her Coumadin, hypertension, hyperlipidemia, who presents from a nursing home with signs of stroke since yesterday. History is obtained from the family due to patient's status. She was confused throughout the day yesterday and apparently last night, her speech was nonsensical. Overnight, she became progressively worse and her speech became increasingly dysarthric. She was also noted to have left facial droop and left sided weakness sometime around 3 AM on the morning of admission. On 09/06/2014 she was discharged to a nursing home after a brief hospitalization at [facility] for a diverticular bleed. During that admission her Coumadin therapy of 15 years was held. No report of rectal bleeding since discharge.  The patient was briefly intubated in the emergency department for airway protection, pulmonary and critical care service was consulted. After discussion with family members, who were not present at the time of intubation, her CODE STATUS was changed to DO NOT RESUSCITATE. Subsequently, patient was extubated with plan to consider comfort care. IMTS requested to admit the patient.   Review of Systems: Unable to obtain a review of systems due to patient's condition. At family member reported that have not seen rectal bleeding and no recent falls. No  fevers, chills, or vomiting. She was able to swallow water and eat food until yesterday.   Meds: Current Facility-Administered Medications  Medication Dose Route Frequency Provider Last Rate Last Dose  . fentaNYL (SUBLIMAZE) 2,500 mcg in sodium chloride 0.9 % 250 mL (10 mcg/mL) infusion  100 mcg/hr Intravenous Continuous Tomasita Crumble, MD   100 mcg/hr at September 14, 2014 0723  . morphine 100 mg in dextrose 5 % 100 mL (1 mg/mL) infusion  10 mg/hr Intravenous Continuous Alyson Reedy, MD      . morphine bolus via infusion 5-20 mg  5-20 mg Intravenous Q15 min PRN Alyson Reedy, MD      . nicardipine (CARDENE)  in 0.86% saline IV infusion (0.1 mg/ml)  3-15 mg/hr Intravenous Once Tomasita Crumble, MD       Current Outpatient Prescriptions  Medication Sig Dispense Refill  . acetaminophen (TYLENOL) 325 MG tablet Take 650 mg by mouth every 6 (six) hours as needed for mild pain.      Marland Kitchen acetaminophen (TYLENOL) 500 MG tablet Take 1,000 mg by mouth every 6 (six) hours as needed for mild pain.      Marland Kitchen diltiazem (DILACOR XR) 120 MG 24 hr capsule Take 120 mg by mouth daily.      . furosemide (LASIX) 40 MG tablet Take 40 mg by mouth daily.      . Multiple Vitamin (TAB-A-VITE PO) Take 1 tablet by mouth daily.      Marland Kitchen  nystatin (MYCOSTATIN) powder Apply 1 g topically 2 (two) times daily.      . potassium chloride (KLOR-CON) 8 MEQ tablet Take 8 mEq by mouth daily.      . pravastatin (PRAVACHOL) 20 MG tablet Take 20 mg by mouth at bedtime.        Allergies: Allergies as of September 20, 2014 - Review Complete 09/20/2014  Allergen Reaction Noted  . Nsaids Other (See Comments) 09/20/2014   No past medical history on file. No past surgical history on file. No family history on file. History   Social History  . Marital Status: Married    Spouse Name: N/A    Number of Children: N/A  . Years of Education: N/A   Occupational History  . Not on file.   Social History Main Topics  . Smoking status: Not on file  .  Smokeless tobacco: Not on file  . Alcohol Use: Not on file  . Drug Use: Not on file  . Sexual Activity: Not on file   Other Topics Concern  . Not on file   Social History Narrative  . No narrative on file      Physical Exam: Blood pressure 152/88, pulse 113, temperature 99.7 F (37.6 C), temperature source Axillary, resp. rate 20, SpO2 100.00%.  General:  acutely ill with moderate distress Head: atraumatic, normocephalic,  Eye: pupils equal, round and reactive; sclera anicteric; normal conjunctiva  Neck: supple, Lungs/Chest wall: Agonal breathing, with respiratory pauses noted. Otherwise clear to auscultation bilaterally, normal work of breathing  Heart: , tachycardia, with a regular rate and rhythm; no murmurs Pulses: radial and dorsalis pedis pulses are 2+ and symmetric  Abdomen: Normal fullness, no rebound, guarding, or rigidity; normal bowel sounds; no masses or organomegaly  Skin: warm, dry, intact, normal turgor, no rashes  Extremities: no peripheral edema, clubbing, or cyanosis Neurologic:  patient unable to participate in a full neurological exam.   Motor: Tone is normal. Bulk is normal. Able to move right arm, no movement in left arm, and appears flaccid, decreased movement left leg. Sensory: No tested, but previously noted by neurology as reduced on the left side. Deep Tendon Reflexes: 2+ and symmetric in the biceps and patellae.  Lab results: Basic Metabolic Panel:  Recent Labs  96/29/52 0603 September 20, 2014 0608  NA 135* 133*  K 4.2 4.1  CL 91* 94*  CO2 26  --   GLUCOSE 160* 159*  BUN 19 19  CREATININE 0.68 0.80  CALCIUM 9.3  --    Liver Function Tests:  Recent Labs  09/20/14 0603  AST 37  ALT 34  ALKPHOS 85  BILITOT 0.6  PROT 7.7  ALBUMIN 3.3*  CBC:  Recent Labs  09-20-14 0603 Sep 20, 2014 0608  WBC 12.4*  --   NEUTROABS 8.3*  --   HGB 12.8 14.3  HCT 38.7 42.0  MCV 93.7  --   PLT 390  --    CBG:  Recent Labs  09-20-2014 0611  GLUCAP 169*    Coagulation:  Recent Labs  2014-09-20 0603  LABPROT 13.4  INR 1.02   Urine Drug Screen: Drugs of Abuse     Component Value Date/Time   LABOPIA NONE DETECTED 09-20-14 0751   COCAINSCRNUR NONE DETECTED 09-20-14 0751   LABBENZ NONE DETECTED 09-20-14 0751   AMPHETMU NONE DETECTED Sep 20, 2014 0751   THCU NONE DETECTED 2014-09-20 0751   LABBARB NONE DETECTED 09-20-2014 0751    Alcohol Level:  Recent Labs  09-20-14 0603  ETH <11  Urinalysis:  Recent Labs  2014-10-05 0751  COLORURINE AMBER*  LABSPEC 1.021  PHURINE 5.0  GLUCOSEU NEGATIVE  HGBUR MODERATE*  BILIRUBINUR NEGATIVE  KETONESUR NEGATIVE  PROTEINUR 100*  UROBILINOGEN 1.0  NITRITE NEGATIVE  LEUKOCYTESUR NEGATIVE    Imaging results:  Ct Head Wo Contrast  10-05-14   CLINICAL DATA:  Code stroke. Left-sided weakness and facial droop. Slurred speech.  EXAM: CT HEAD WITHOUT CONTRAST  TECHNIQUE: Contiguous axial images were obtained from the base of the skull through the vertex without intravenous contrast.  COMPARISON:  None.  FINDINGS: No acute osseous findings. There is a small incidental osteoma from the central frontal bone. Dermal inclusion cyst in the high right frontal scalp.  There is cytotoxic edema involving the right temporal operculum, posterior temporal lobe, and extending towards the inferior right occipital lobe. The visible infarct may not be third of the territory, but is still extensive. Abnormal high attenuation in the region of the right M1 segment. There may be blurring of the upper right putamen. No evidence of hemorrhage.  Mild periventricular white matter disease.  Cerebral volume loss which is age appropriate.  Critical Value/emergent results were called by telephone at the time of interpretation on Oct 05, 2014 at 6:23 am to Dr. Tomasita Crumble , who verbally acknowledged these results.  IMPRESSION: 1. Probable right M1 thrombus. 2. Visible infarct in posterior right MCA territory, inferior division. There  is extension into the inferior right occipital lobe, likely posterior watershed.   Electronically Signed   By: Tiburcio Pea M.D.   On: October 05, 2014 06:26   Dg Chest Portable 1 View  10-05-14   CLINICAL DATA:  Hypoxia  EXAM: PORTABLE CHEST - 1 VIEW  COMPARISON:  September 04, 2014  FINDINGS: Endotracheal tube tip is 5.2 cm above the carina. No pneumothorax. There is mild interstitial edema. There is no airspace consolidation. Heart is mildly enlarged. The pulmonary vascularity is within normal limits. No adenopathy. There is atherosclerotic change in aorta. No bone lesions.  IMPRESSION: Endotracheal tube as described without pneumothorax. Cardiomegaly with mild interstitial edema. Suspect a degree of congestive heart failure. No airspace consolidation.   Electronically Signed   By: Bretta Bang M.D.   On: 10-05-2014 07:03    Other results: EKG: atrial fibrillation, rate 126, no acute signs of ischemia by ST segment elevations or T wave changes.  Assessment & Plan by Problem: Active Problems:   Unspecified cerebral artery occlusion with cerebral infarction   Acute respiratory failure   Atrial fibrillation   Hypertension   CVA (cerebral infarction)  Right MCA Acute Ischemic Stroke: presents with signs of a large right MCA infarct. Head CT scan reveals probable right M1 thrombus, and visible infarct in the posterior right MCA territory, inferior division with extension into the inferior right occipital lobe, likely posterior watershed. Patient was on anticoagulation until last week when the Coumadin was stopped to due to GI bleed in the setting of diverticulosis. She is outside the TPA window. Has been evaluated by neurology. Plan. - Will admit patient to a MedSurg bed - Discussed with the family about poor prognosis with a massive MCA, They are leaning toward comfort care at this time. - Consult palliative care for goal of care  - No feeding tube per the patient's family - Sublingual  Ativan 1 mg q2hrs prn for agitation - haloperidol, sublingual 0.5 mg every 6hr prn for agitation  - IV morphine 1-2 mg q2hrs prn for signs of pain and respiratory distress - Neurology has  signed off since patient is now comfort care  - Chaplain present to provide spiritual care   Acute respiratory failure: This is related to acute CVA. Briefly intubated in the ED for airway protection, prior to arrival of her family, who confirmed DO NOT RESUSCITATE status, and the patient subsequently extubated. Plan. -Currently DNR - Will use morphine for pain no respiratory distress - Will acquire frequent suctioning  Atrial Fibrillation with RVR:  EKG, with evidence of RVR. Off anticoagulation with recent GI bleed. In the ED, received Cardizem, which has since been discontinued. Plan. -May try IV metoprolol, for heart rate > 140 beats/min -No anticoagulation in the setting of acute CVA   Recent Diverticular Bleed: The patient's family, she was recently admitted at [facility], where she suffered a GI bleed from diverticulosis. She was taken off. Her Coumadin. She has not had any GI bleed episode since returned to the nursing home. Hemoglobin is 12.8. Plan. -Will continue to monitor clinically.   Hypertension: Hold home antihypertensive medications, including   furosemide 40 mg daily, Diltiazem 120 mg daily.   Code Status: DNR  F/E/N: NPO  Family communication:  plan of care has been communicated to the family including patient's HPOA, Jillian Kennedy [phone]), patient's daughter Jillian Kennedy [phone]) and patient's grandson Jillian Kennedy [phone]). They were all present at bedside and agree to comfort care at this time. Will have palliative care see patient as well.   VTE Ppx: None  Dispo: Disposition is deferred at this time, awaiting improvement of current medical problems. Anticipated discharge in approximately 2-3 day(s).   The patient does have a current PCP Lauro Regulus,  MD), therefore will be require OPC follow-up after discharge.   The patient does not have transportation limitations that hinder transportation to clinic appointments.   Signed:  Dow Adolph, MD PGY-3 Internal Medicine Teaching Service Pager: [phone] (7pm-7am) 09/28/14, 9:48 AM

## 2014-09-20 NOTE — ED Notes (Signed)
Chaplain called for the family

## 2014-09-20 NOTE — Procedures (Signed)
Intubation Procedure Note Jillian Kennedy [id number] 1925/11/08  Procedure: Intubation Indications: Airway protection and maintenance  Procedure Details Consent: Unable to obtain consent because of altered level of consciousness. Time Out: Verified patient identification, verified procedure, site/side was marked, verified correct patient position, special equipment/implants available, medications/allergies/relevent history reviewed, required imaging and test results available.  Performed  Maximum sterile technique was used including antiseptics, gloves and hand hygiene.  MAC and 3    Evaluation Hemodynamic Status: BP stable throughout; O2 sats: stable throughout Patient's Current Condition: stable Complications: No apparent complications Patient did tolerate procedure well. Chest X-ray ordered to verify placement.  CXR: tube position acceptable.   Inez Pilgrim 2014-09-23

## 2014-09-20 NOTE — Progress Notes (Signed)
Pt was on iv morphine drip before passing, wasted about  ( ) in the pyxis room sinc at 2200,verified by a second RN Messan Tepe . Obasogie-Asidi, Geneva Barrero Efe

## 2014-09-20 NOTE — ED Notes (Signed)
Dr. yacoub at the bedside.  

## 2014-09-20 DEATH — deceased

## 2015-04-13 NOTE — Discharge Summary (Signed)
Dates of Admission and Diagnosis:  Date of Admission 01-Sep-2014   Date of Discharge 05-Sep-2014   Admitting Diagnosis GI bleed   Final Diagnosis Lower GI bleed, diverticulosis, chronic atrial fibrillation   Discharge Diagnosis 1 Lower GI bleed, diverticulosis   2 Chronic atrial fibrillation   3 Moderate aortic stenosis   4 Hypertension   5 Hyperlipidemia    Chief Complaint/History of Present Illness 81 year old lady with h/o hypertension, moderate aortic stenosis and chronic atrial fibrillation on chronic coumadin therapy presented with bright red bleeding per rectum.   Allergies:  NSAIDS: Other    Hepatic:  12-Sep-15 04:23   Bilirubin, Total 0.5  Alkaline Phosphatase 54 (46-116 NOTE: New Reference Range 07/10/14)  SGPT (ALT) 18 (14-63 NOTE: New Reference Range 07/10/14)  SGOT (AST) 29  Total Protein, Serum 7.1  Albumin, Serum 3.4  Routine BB:  12-Sep-15 04:23   ABO Group + Rh Type O Positive  Antibody Screen NEGATIVE (Result(s) reported on 01 Sep 2014 at 05:08AM.)  Routine Chem:  12-Sep-15 04:23   Glucose, Serum  112  BUN  27  Creatinine (comp) 1.04  Sodium, Serum 140  Potassium, Serum 3.9  Chloride, Serum 107  CO2, Serum 23  Calcium (Total), Serum 8.5  Anion Gap 10  Osmolality (calc) 285  eGFR (African American)  56  eGFR (Non-African American)  48 (eGFR values <36m/min/1.73 m2 may be an indication of chronic kidney disease (CKD). Calculated eGFR is useful in patients with stable renal function. The eGFR calculation will not be reliable in acutely ill patients when serum creatinine is changing rapidly. It is not useful in  patients on dialysis. The eGFR calculation may not be applicable to patients at the low and high extremes of body sizes, pregnant women, and vegetarians.)  Routine Coag:  12-Sep-15 04:23   Prothrombin  25.9  INR 2.4 (INR reference interval applies to patients on anticoagulant therapy. A single INR therapeutic range for  coumarins is not optimal for all indications; however, the suggested range for most indications is 2.0 - 3.0. Exceptions to the INR Reference Range may include: Prosthetic heart valves, acute myocardial infarction, prevention of myocardial infarction, and combinations of aspirin and anticoagulant. The need for a higher or lower target INR must be assessed individually. Reference: The Pharmacology and Management of the Vitamin K  antagonists: the seventh ACCP Conference on Antithrombotic and Thrombolytic Therapy. CYSAYT.0160Sept:126 (3suppl): [id number] A HCT value >55% may artifactually increase the PT.  In one study,  the increase was an average of 25%. Reference:  "Effect on Routine and Special Coagulation Testing Values of Citrate Anticoagulant Adjustment in Patients with High HCT Values." American Journal of Clinical Pathology 2006;126:400-405.)  Routine Hem:  12-Sep-15 04:23   WBC (CBC) 8.0  RBC (CBC) 4.31  Hemoglobin (CBC) 13.3  Hematocrit (CBC) 41.3  Platelet Count (CBC) 183 (Result(s) reported on 01 Sep 2014 at 04:33AM.)  MCV 96  MCH 31.0  MCHC 32.3  RDW 14.2    08:20   Hemoglobin (CBC)  11.7 (Result(s) reported on 01 Sep 2014 at 08:38AM.)    14:37   Hemoglobin (CBC)  11.4 (Result(s) reported on 01 Sep 2014 at 02:52PM.)    19:48   Hemoglobin (CBC)  11.4 (Result(s) reported on 01 Sep 2014 at 08:01PM.)  13-Sep-15 03:28   WBC (CBC) 6.2  Hemoglobin (CBC)  11.4  14-Sep-15 03:08   WBC (CBC) 8.4  Hemoglobin (CBC) 12.5  15-Sep-15 05:41   WBC (CBC)  12.8  Hemoglobin (CBC)  11.6  16-Sep-15 12:08   WBC (CBC)  13.1  Hemoglobin (CBC) 12.3   Pertinent Past History:  Pertinent Past History Chronic atrial fibrillation, Moderate aortic stenosis Hypertension Hyperlipidemia   Hospital Course:  Hospital Course 81 year old lady with h/o hypertension, moderate aortic stenosis and chronic atrial fibrillation on chronic coumadin therapy  admitted with lower GI bleed. INR was 2.4  on initial labs. Anti-coagulation was reversed with vitamin K and FFP. Patient received IV fluids and supportive care. She was kept NPO. H/H remained stable. Tagged RBC scan was negative. Patient was evaluated by GI. Colonoscopy revealed diverticulosis, otherwise negative. Chronic atrial fibrillation remained rate controlled with oral Cardizem ER. Coumadin remained on hold for GI bleed. Hypertension was controlled with Cardizem ER. Pravastatin was continued for hyperlipidemia. I discussed the plan for anticoagulation with Dr. Clayborn Bigness. Cardiology recommendation was to hold Coumadin for 2-4 weeks until her cardiology follow up as out patient. No aspirin recommended at this time. Mild leukocytosis WBC 13.1, no left shift, CXR ? CHF vs atypical pneumonia. Shall start Z pak for antibiotic coverage. Plan is to discharge to [facility] today.   Condition on Discharge Guarded   DISCHARGE INSTRUCTIONS HOME MEDS:  Medication Reconciliation: Patient's Home Medications at Discharge:     Medication Instructions  pravastatin 20 mg oral tablet  1 tab(s) orally once a day (in the evening)   potassium chloride 8 meq oral tablet, extended release  1 tab(s) orally once a day   furosemide 40 mg oral tablet  1 tab(s) orally once a day   tylenol 500 mg oral tablet  2 tab(s) orally every 6 hours, As Needed - for Pain    acetaminophen 325 mg oral tablet  2 tab(s) orally every 4 hours, As needed, mild pain (1-3/10) or temp. greater than 100.4   diltiazem  120 milligram(s) orally once a day   nystatin 100,000 units/g topical powder  1 application topically 2 times a day   multivitamin  1 tab(s) orally once a day   azithromycin 5 day dose pack      STOP TAKING THE FOLLOWING MEDICATION(S):    coumadin 4 mg oral tablet: 1 tab(s) orally once a day (in the evening) multi-day plus minerals multiple vitamins with minerals oral tablet: 1 tab(s) orally once a day  Physician's Instructions:  Diet Low Sodium   Activity  Limitations As tolerated   Return to Work Not Applicable   Time frame for Follow Up Appointment 2-4 weeks  [facility]   Time frame for Follow Up Appointment 2-4 weeks  Dr. Glendon Axe   Other Comments Cardiology recommendation is to hold Coumadin for 2-4 weeks until her cardiology follow up as out patient. No aspirin recommended at this time.     Candiss Norse, Lucianne Smestad(Attending Physician): [facility], [address] [place], [place] [address], [place]   Lujean Amel D(Consultant): [facility], [address] [place], [place] [address], [place] [phone]  Electronic Signatures: Glendon Axe (MD)  (Signed 16-Sep-15 13:38)  Authored: ADMISSION DATE AND DIAGNOSIS, CHIEF COMPLAINT/HPI, Allergies, PERTINENT LABS, PERTINENT PAST HISTORY, HOSPITAL COURSE, [place], PATIENT INSTRUCTIONS, Follow Up Physician   Last Updated: 16-Sep-15 13:38 by Glendon Axe (MD)

## 2015-04-13 NOTE — Consult Note (Signed)
Chief Complaint:  Subjective/Chief Complaint patient has no further bleeding at this point with mild anemia no abdominal pain none unstable   VITAL SIGNS/ANCILLARY NOTES: **Vital Signs.:   15-Sep-15 08:28  Temperature Temperature (F) 98.3  Celsius 36.8  Pulse Pulse 98  Respirations Respirations 20  Systolic BP Systolic BP 169  Diastolic BP (mmHg) Diastolic BP (mmHg) 90  Mean BP 108  Pulse Ox % Pulse Ox % 96  Pulse Ox Activity Level  At rest  Oxygen Delivery Room Air/ 21 %  *Intake and Output.:   Daily 15-Sep-15 07:00  Grand Totals Intake:   Output:  0    Net:  0 24 Hr.:  0  Urine ml     Out:  0  Length of Stay Totals Intake:  5897 Output:      Net:  4503   Brief Assessment:  GEN well developed, well nourished, no acute distress   Cardiac Irregular  murmur present   Respiratory normal resp effort  clear BS   Gastrointestinal Normal   Gastrointestinal details normal Soft  Nontender  Nondistended   EXTR negative cyanosis/clubbing   Lab Results: Routine Chem:  14-Sep-15 03:08   Glucose, Serum 98  BUN 8  Creatinine (comp) 0.77  Sodium, Serum 143  Potassium, Serum 3.7  Chloride, Serum 107  CO2, Serum 26  Calcium (Total), Serum 8.5  Anion Gap 10  Osmolality (calc) 283  eGFR (African American) >60  eGFR (Non-African American) >60 (eGFR values <24m/min/1.73 m2 may be an indication of chronic kidney disease (CKD). Calculated eGFR is useful in patients with stable renal function. The eGFR calculation will not be reliable in acutely ill patients when serum creatinine is changing rapidly. It is not useful in  patients on dialysis. The eGFR calculation may not be applicable to patients at the low and high extremes of body sizes, pregnant women, and vegetarians.)  Routine Hem:  14-Sep-15 03:08   WBC (CBC) 8.4  RBC (CBC) 3.89  Hemoglobin (CBC) 12.5  Hematocrit (CBC) 37.5  Platelet Count (CBC) 183  MCV 96  MCH 32.2  MCHC 33.5  RDW 14.1  Neutrophil % 62.6   Lymphocyte % 23.0  Monocyte % 12.5  Eosinophil % 1.2  Basophil % 0.7  Neutrophil # 5.3  Lymphocyte # 1.9  Monocyte #  1.1  Eosinophil # 0.1  Basophil # 0.1 (Result(s) reported on 03 Sep 2014 at 04:27AM.)  15-Sep-15 05:41   WBC (CBC)  12.8  RBC (CBC)  3.74  Hemoglobin (CBC)  11.6  Hematocrit (CBC) 35.9  Platelet Count (CBC) 185  MCV 96  MCH 31.0  MCHC 32.4  RDW 14.0  Neutrophil % 68.9  Lymphocyte % 17.9  Monocyte % 12.6  Eosinophil % 0.3  Basophil % 0.3  Neutrophil #  8.8  Lymphocyte # 2.3  Monocyte #  1.6  Eosinophil # 0.0  Basophil # 0.0 (Result(s) reported on 04 Sep 2014 at 0[facility])   Radiology Results: XRay:    12-Sep-15 05:16, Chest Portable Single View  Chest Portable Single View   REASON FOR EXAM:    GI BLEED, RHONCHI  COMMENTS:       PROCEDURE: DXR - DXR PORTABLE CHEST SINGLE VIEW  - Sep 01 2014  5:16AM     CLINICAL DATA:  Rhonchi.  GI bleeding.    EXAM:  PORTABLE CHEST - 1 VIEW    COMPARISON:  None.    FINDINGS:  The lungs are well-aerated. Mild right basilar airspace opacity may  reflect  pneumonia. There is no evidence of pleural effusion or  pneumothorax.    The cardiomediastinal silhouette is borderline normal in size. No  acute osseous abnormalities are seen.     IMPRESSION:  Mild right basilar airspace opacity may reflect pneumonia.      Electronically Signed    By: Garald Balding M.D.    On: 09/01/2014 05:45         Verified By: JEFFREY . CHANG, M.D.,    15-Sep-15 18:06, Chest 1 View AP or PA  Chest 1 View AP or PA   REASON FOR EXAM:    rule out pnemonia  COMMENTS:       PROCEDURE: DXR - DXR CHEST 1 VIEWAP OR PA  - Sep 04 2014  6:06PM     CLINICAL DATA:  Cough.  Shortness of breath.    EXAM:  CHEST - 1 VIEW    COMPARISON:  09/01/2014    FINDINGS:  Mild cardiomegaly with diffuse interstitial and patchy airspace  opacities in both lungs, increased from prior. Atherosclerotic  aortic arch. Mild blunting of the left  lateral costophrenic angle.     IMPRESSION:  1. Worsened bilateral interstitial opacities with some patchy  airspace opacities in both lungs. I favor acute pulmonary edema  given the underlying cardiomegaly, although atypical pneumonia can  have a similar appearance.  2. Suspected small left pleural effusion.  3. Atherosclerotic aortic arch.      Electronically Signed    By: Sherryl Barters M.D.    On: 09/04/2014 21:58     Verified By: Carron Curie, M.D.,  Cardiology:    12-Sep-15 04:05, ECG  Ventricular Rate 100  Atrial Rate 55  QRS Duration 92  QT 362  QTc 466  R Axis -32  T Axis 24  ECG interpretation   Undetermined rhythm  Left axis deviation  Incomplete right bundle branch block  Septal infarct , age undetermined  Abnormal ECG  When compared with ECG of 11-Mar-2002 08:57,  Current undetermined rhythm precludes rhythm comparison, needs review  Incomplete right bundle branch block is now Present  Septal infarct is now Present  Nonspecific T wave abnormality now evident in Anterior leads  ----------unconfirmed----------  Confirmed by OVERREAD, NOT ([id number]), editor PEARSON, BARBARA ([id number]) on 09/03/2014 8:58:29 AM  ECG   Nuclear Med:    12-Sep-15 18:22, GI Blood Loss Study - Nuc Med  GI Blood Loss Study - Nuc Med   REASON FOR EXAM:    GI bleed  COMMENTS:       PROCEDURE: NM  - NM GI BLOOD LOSS STUDY  - Sep 01 2014  6:22PM     CLINICAL DATA:  GI bleed.    EXAM:  NUCLEAR MEDICINE GASTROINTESTINAL BLEEDING SCAN    TECHNIQUE:  Sequential abdominal images were obtained following intravenous  administration of Tc-18mlabeled red blood cells.    RADIOPHARMACEUTICALS:  21.7 mCi Tc-927mn-vitro labeled red cells.  COMPARISON:  None.    FINDINGS:  No active GI bleed is identified over a 60 min time period.     IMPRESSION:  Negative nuclear medicine bleeding scan.      Electronically Signed    By: MaKalman Jewels.D.    On: 09/01/2014 18:30          Verified By: PEMarlane HatcherM.D.,   Assessment/Plan:  Assessment/Plan:  Assessment IMP LGI bleeding Anemia AFIB Obesity HTN Dementia Weakness .   Plan PLAN Agree with GI evaluation and therapy Continue to  hold coumadin for at least 2-4 weeks Continue to f/u on H/H Change diet to to improved diverticulosis Rec weight loss exericse when able Agree with rehab to regain strenght Consider further evatution for dementia and memory Continue rate control for AFIB F/U cardiology 2-3 week before restarting coumadin  recommend close follow-up for possible further GI bleeding   Electronic Signatures: Lujean Amel D (MD)  (Signed 11-Nov-15 16:05)  Authored: Chief Complaint, VITAL SIGNS/ANCILLARY NOTES, Brief Assessment, Lab Results, Radiology Results, Assessment/Plan   Last Updated: 11-Nov-15 16:05 by Lujean Amel D (MD)

## 2015-04-13 NOTE — Consult Note (Signed)
PATIENT NAME:  Jillian Kennedy, Jillian Kennedy MR#:  [id number] DATE OF BIRTH:  [birth year]  DATE OF CONSULTATION:  09/03/2014  REFERRING PHYSICIAN:  Leotis Shames, MD CONSULTING PHYSICIAN:  Dwayne D. Juliann Pares, MD  CARDIOLOGIST: Dr. Allena Katz at [facility].  INDICATION FOR CONSULTATION: Atrial fibrillation, recent GI bleed.   HISTORY OF PRESENT ILLNESS: Ms. Ramthun is an 81 year old white female with known history of hypertension, atrial fibrillation on Coumadin, hyperlipidemia and obesity who presented with bright red blood per rectum. She had been having rectal bleeding which started yesterday. She has had multiple episodes. There was a significant amount in the Emergency Room. She noticed hematochezia. The patient denies previous episodes. She denies any shortness of breath or pain. No significant emesis or abdominal pain. She does not have a history of GI bleed in the past. Per her the most recent colonoscopy was more than 10 years ago. Denies any history of peptic ulcer disease or NSAID use while on Coumadin. Hemoglobin was 13.3. The most recent hemoglobin before today was 16, in October of last year. Blood pressure was maintained at 144/86. She is slightly tachycardic and in A-fib with rate 100. INR was actually okay at 2.4. She was given some vitamin K and given FFP to help reverse her anticoagulation because of the bleeding. She was admitted for further evaluation. Denied any chest pain. Chest x-ray shows possible pneumonia, but denies any shortness of breath or cough. No elevated white count, so she is being admitted and evaluated for further management. She has been on Coumadin for several years.   PAST MEDICAL HISTORY: Hypertension, anxiety, obesity, DJD, atrial fibrillation, right acoustic neuroma, liver abscess, diabetes controlled on diet, history of CHF, moderate aortic stenosis.  PAST SURGICAL HISTORY: Hysterectomy, appendectomy, right knee replacement, chest tube placement.   ALLERGIES: NSAIDS.  HOME  MEDICATIONS: Digoxin 0.125 a day, diltiazem 360 a day, Coumadin 4 mg daily, Pravachol 20 a day, Lasix 40 a day, potassium 8 mEq daily, multivitamin once a day.   REVIEW OF SYSTEMS: Denies blackout spells as well as syncope. No significant nausea and vomiting. No fever. No chills. No sweats. No weight loss or weight gain. No hemoptysis. No hematemesis. She has bright red blood per rectum and mild hematochezia. Denies any sputum production or cough.   PHYSICAL EXAMINATION: VITAL SIGNS: Blood pressure when I saw the patient was 140/80, pulse 95 and irregular, respiratory rate 16, afebrile.  HEENT: Normocephalic, atraumatic. Pupils equal and reactive to light.  NECK: Supple. No significant JVD, bruits or adenopathy.  LUNGS: Clear to auscultation and percussion. No significant wheeze, rhonchi, or rale.  HEART: Irregularly irregular. Systolic ejection murmur along the left sternal border. PMI nondisplaced.  ABDOMEN: Benign. Positive bowel sounds. No rebound, guarding or tenderness.  EXTREMITIES: Within normal limits. NEUROLOGIC: Intact.  SKIN: Normal.   DIAGNOSTIC DATA: Glucose 112, BUN 27, creatinine 1.04, sodium 140, potassium 3.9, chloride 107, CO2 23. White count 8, hemoglobin 13.3, hematocrit 41.3, platelet count 183,000. INR 2.4.   Chest x-ray: Right middle lobe opacity, possible pneumonia or atelectasis.   ASSESSMENT: 1.  Hematochezia with lower gastrointestinal bleeding. 2.  Atrial fibrillation. 3.  Coagulopathy secondary to Coumadin. 4.  Hypertension. 5.  Hyperlipidemia. 6.  Diet controlled diabetes. 7.  Obesity.   PLAN: 1.  Agree with admit. Continue current therapy. Agree with workup for lower gastrointestinal bleed. Reverse anticoagulation. The patient reportedly with history of diverticulosis which could be causing bleeding. Continue gastrointestinal work up for now.  2.  For atrial fibrillation, reverse anticoagulation for  now until the bleeding stops and is addressed. The risk  of cerebrovascular accident in the short term is minimal. Because of her atrial fibrillation may have to readdress anticoagulation later once her gastrointestinal bleeding is under control. Continue rate control for atrial fibrillation.  3.  For hypertension, continue blood pressure control as you are doing with reasonable control at this stage.  4.  For hyperlipidemia, I agree with statin therapy as has been done in the past.  5.  For diet-controlled diabetes, continue to follow and monitor. Use short-acting insulin as necessary. The patient will have reduced p.o. intake because of work-up for her gastrointestinal bleeding, which should lead to better control for diabetes.  6.  Continue deep vein thrombosis prophylaxis at this stage. I will continue to follow the patient.   ____________________________ Bobbie Stackwayne D. Juliann Paresallwood, MD ddc:sb D: 09/04/2014 06:35:35 ET T: 09/04/2014 07:55:31 ET JOB#: [id number]  cc: Dwayne D. Juliann Paresallwood, MD, <Dictator> Alwyn PeaWAYNE D CALLWOOD MD ELECTRONICALLY SIGNED 10/05/2014 14:35

## 2015-04-13 NOTE — Consult Note (Signed)
Chief Complaint:  Subjective/Chief Complaint No further signs of bleeding. Bleeding can negative. Hb stable. The patient deneis any abd pain.   VITAL SIGNS/ANCILLARY NOTES: **Vital Signs.:   13-Sep-15 12:55  Vital Signs Type Q 4hr  Temperature Temperature (F) 99.5  Celsius 37.5  Temperature Source oral  Pulse Pulse 98  Respirations Respirations 18  Systolic BP Systolic BP 245  Diastolic BP (mmHg) Diastolic BP (mmHg) 84  Mean BP 101  Pulse Ox % Pulse Ox % 96  Pulse Ox Activity Level  At rest  Oxygen Delivery Room Air/ 21 %  Telemetry pattern Cardiac Rhythm Atrial fibrillation   Brief Assessment:  GEN well developed, well nourished   Respiratory normal resp effort  no use of accessory muscles   Additional Physical Exam Alert and orientated times 3   Lab Results: Routine Chem:  13-Sep-15 03:28   Glucose, Serum  101  BUN 13  Creatinine (comp) 0.76  Sodium, Serum 143  Potassium, Serum  3.2  Chloride, Serum  110  CO2, Serum 27  Calcium (Total), Serum  8.4  Anion Gap  6  Osmolality (calc) 285  eGFR (African American) >60  eGFR (Non-African American) >60 (eGFR values <37m/min/1.73 m2 may be an indication of chronic kidney disease (CKD). Calculated eGFR is useful in patients with stable renal function. The eGFR calculation will not be reliable in acutely ill patients when serum creatinine is changing rapidly. It is not useful in  patients on dialysis. The eGFR calculation may not be applicable to patients at the low and high extremes of body sizes, pregnant women, and vegetarians.)  Magnesium, Serum  1.5 (1.8-2.4 THERAPEUTIC RANGE: 4-7 mg/dL TOXIC: > 10 mg/dL  -----------------------)  Routine Coag:  13-Sep-15 03:28   Prothrombin  15.7  INR 1.3 (INR reference interval applies to patients on anticoagulant therapy. A single INR therapeutic range for coumarins is not optimal for all indications; however, the suggested range for most indications is 2.0 -  3.0. Exceptions to the INR Reference Range may include: Prosthetic heart valves, acute myocardial infarction, prevention of myocardial infarction, and combinations of aspirin and anticoagulant. The need for a higher or lower target INR must be assessed individually. Reference: The Pharmacology and Management of the Vitamin K  antagonists: the seventh ACCP Conference on Antithrombotic and Thrombolytic Therapy. CYKDXI.3382Sept:126 (3suppl): [id number] A HCT value >55% may artifactually increase the PT.  In one study,  the increase was an average of 25%. Reference:  "Effect on Routine and Special Coagulation Testing Values of Citrate Anticoagulant Adjustment in Patients with High HCT Values." American Journal of Clinical Pathology 2006;126:400-405.)  Routine Hem:  13-Sep-15 03:28   WBC (CBC) 6.2  RBC (CBC)  3.59  Hemoglobin (CBC)  11.4  Hematocrit (CBC)  34.3  Platelet Count (CBC) 160  MCV 95  MCH 31.7  MCHC 33.2  RDW 14.5  Neutrophil % 63.7  Lymphocyte % 22.7  Monocyte % 11.2  Eosinophil % 1.7  Basophil % 0.7  Neutrophil # 3.9  Lymphocyte # 1.4  Monocyte # 0.7  Eosinophil # 0.1  Basophil # 0.0 (Result(s) reported on 02 Sep 2014 at 04:35AM.)   Assessment/Plan:  Assessment/Plan:  Assessment Lower GI bleed.   Plan No further sign of bleeding. patient to have colonsocopy tomorrow by Dr. OCandace Cruise   Electronic Signatures: WLucilla Lame(MD)  (Signed 13-Sep-15 13:25)  Authored: Chief Complaint, VITAL SIGNS/ANCILLARY NOTES, Brief Assessment, Lab Results, Assessment/Plan   Last Updated: 13-Sep-15 13:25 by WLucilla Lame(MD)

## 2015-04-13 NOTE — H&P (Signed)
PATIENT NAME:  Jillian Kennedy, RAU MR#:  [id number] DATE OF BIRTH:  09-20-1925  DATE OF ADMISSION:  09/01/2014  REFERRING: PHYSICIAN: Jade J. Dolores Frame, MD  PRIMARY CARE PHYSICIAN: Leotis Shames, MD  CHIEF COMPLAINT: Bright red blood per rectum.   HISTORY OF PRESENT ILLNESS: This is an 81 year old female with known history of hypertension, atrial fibrillation on warfarin, and hyperlipidemia, who presents with bright red blood per rectum, reports she has been having rectal bleed, which started yesterday evening. Multiple episodes, x3 so far, significant amount. In the ED, the patient was noticed to have hematochezia. She denies any shortness of breath or any chest pain, any coffee-ground emesis, fever, chills, abdominal pain, but reports having some dizziness and lightheadedness. The patient denies any history of GI bleed in the past. Reports that her most recent colonoscopy was more than 10 years ago. Denies any history of peptic ulcer disease, any use of NSAIDs as well. The patient's hemoglobin was stable at 13.3; most recent value we have on her was in October of last year of 16.3.   The patient's blood pressure was 145/86 upon presentation and was slightly tachycardic at 102, with her EKG showing atrial fibrillation. The patient's INR was 2.4. She was given oral vitamin K and ordered 2 units of FFP yesterday to reverse her anticoagulation. The hospitalist service requested to admit the patient for further evaluation and work-up of her GI bleed.   The patient had an x-ray done, which did show a right lung opacity suspicious for pneumonia, but she denies any cough, any fever,  any productive sputum, any shortness of breath, and she does not have any leukocytosis.    PAST MEDICAL HISTORY:  1.  Hypertension.  2.  Anxiety.  3.  Degenerative joint disease.  4.  Atrial fibrillation.  5.  Right acoustic neuroma.  6.  History of liver abscess.  7.  Diabetes mellitus type 2, controlled with diet.  8.  CHF.   9.  Moderate aortic stenosis.     PAST SURGICAL HISTORY:  Hysterectomy for menorrhagia, appendectomy, right knee replacement, chest tube placement in 2003.   ALLERGIES: NONSTEROIDAL ANTI-INFLAMMATORY DRUGS.   HOME MEDICATIONS:  1.  Digoxin 125 mcg one-half tablet oral daily.  2.  Diltiazem 360 mg extended release daily.  3.  Warfarin 4 mg oral in the evening.  4.  Pravastatin 20 mg at bedtime.  5.  Lasix 40 mg daily.  6.  Potassium 8 mEq daily.  7.  Multivitamin 1 tablet daily.    REVIEW OF SYSTEMS:  CONSTITUTIONAL: Denies fever or chills. Reports fatigue and weakness. Denies weight gain or weight loss.  EYES: Denies blurry vision, double vision, or inflammation.  EARS, NOSE, AND THROAT: Denies tinnitus, ear pain, epistaxis or discharge. Reports history of acoustic neuroma. Hard of hearing.  RESPIRATORY: Denies any cough, wheezing, hemoptysis, shortness of breath.  CARDIOVASCULAR: Denies chest pain, orthopnea, edema, palpitation.  GASTROINTESTINAL: Denies nausea, vomiting, diarrhea, abdominal pain, hematemesis, melena. Reports bright red blood per rectum.  GENITOURINARY: Denies dysuria, hematuria, or renal colic.  ENDOCRINOLOGY: Denies polyuria, polydipsia, heat or cold intolerance.  HEMATOLOGY: Denies anemia, easy bruising, bleeding diathesis.  INTEGUMENT: Denies acne, rash, or skin lesion.  MUSCULOSKELETAL: Denies any swelling, gout, cramps.  NEUROLOGIC: Denies CVA, TIA, tremors, vertigo, ataxia. Reports dizziness and lightheadedness.  PSYCHIATRIC: Denies anxiety, insomnia, or depression.    PHYSICAL EXAMINATION:  VITAL SIGNS: Temperature 98.4, pulse 98, respiratory rate 41, blood pressure 152/79, saturating 97% on oxygen.  GENERAL: Frail, elderly female  who looks comfortable in bed, in no apparent distress.  HEENT: Head atraumatic, normocephalic. Pupils equal and reactive to light. Pink conjunctivae. Anicteric sclerae. Moist oral mucosa. No oral thrush. No nasal discharge.   NECK: Supple. No thyromegaly. No JVD. Trachea is midline. No carotid bruits. No nuchal rigidity.  CHEST: Good air entry bilaterally. No wheezing, rales, or rhonchi. No use of accessory muscles.  CARDIOVASCULAR: S1, S2 heard. No rubs, murmur, or gallops.  ABDOMEN: Soft, nontender, nondistended. Bowel sounds present. No rebound. No guarding.  EXTREMITIES: No edema. No clubbing. No cyanosis. Pedal pulses felt bilaterally.  PSYCHIATRIC: Appropriate affect. Awake and alert x 3. Intact judgement and insight.  NEUROLOGIC: Cranial nerves grossly intact. Motor 5/5. No focal deficits. Sensation is symmetrical and intact to light touch bilaterally.  MUSCULOSKELETAL: No joint effusion or erythema.  LYMPHATICS: No cervical or lymphadenopathy could be appreciated.   PERTINENT LABORATORY DATA: Glucose 112, BUN 27, creatinine 1.04, sodium 140, potassium 3.9, chloride 107, CO2 of 23. White blood cell 8, hemoglobin 13.3, hematocrit 41.3, platelets 183,000. INR 2.4.   RADIOLOGICAL DATA:  Chest X-ray: Mild right basilar airspace opacity may reflect pneumonia.   ASSESSMENT AND PLAN:  1.  Hematochezia, this is most likely lower gastrointestinal bleed. Unclear etiology. The patient does not have any a colonoscopy since more than 10 years ago. The patient will be admitted for further workup. We will include check hemoglobin every 6 hours. Will reverse anti-coagulopathy. She was given oral vitamin K. We will give 2 units of FFP and will repeat INR after that. We will keep her on a liquid diet and transfuse as needed. keep active type and screen and consult gastroenterology.  2.  Coagulopathy with elevated INR, this is secondary to warfarin, which is currently being held. We are reversing it with fresh-frozen plasma and vitamin K. We will recheck 1 hour after FFP is transfused.  3.  Hypertension. Blood pressure is acceptable, but will hold oral medication until she is more stable.  4.  Atrial fibrillation, currently rate  controlled. Continue with digoxin. If needed, we will add low-dose Cardizem, short acting, until the patient is more stable, and then she can be resumed back on her long-acting Cardizem and her warfarin is being held currently for her gastrointestinal bleed.  5.  Hyperlipidemia. Continue with statin.  6.  Diabetes mellitus. The patient does not appear to be on any medications at home. It is controlled with diet. We will continue to monitor.  7.  Deep vein thrombosis prophylaxis. Sequential compression device and TED hose. No chemical anticoagulation given her gastrointestinal bleed.   CODE STATUS: Discussed at length with the patient and the family. She does have a Living Will. Reports that she is a Full Code, but she Does Not Wish To Remain On Any Form Of Life Support if she has poor prognosis or poor life quality. Reports a friend who is her healthcare power of attorney; his name is ImmunologistCurtis Trutt.    ____________________________ Starleen Armsawood S. Braxston Quinter, MD dse:MT D: 09/01/2014 04:54:0906:26:23 ET T: 09/01/2014 07:14:43 ET JOB#: [id number]  cc: Starleen Armsawood S. Teghan Philbin, MD, <Dictator> Climmie Buelow Teena IraniS Alfonse Garringer MD ELECTRONICALLY SIGNED 09/02/2014 0:56

## 2015-04-13 NOTE — Consult Note (Signed)
Pt seen and examined. Full consult to follow. Acute rectal bleeding starting last night. Total of 3 bloody stools. No abd pain or cramping. No recent seeds/nuts. No recent NSAIDS use. Chronic coumadin for A.fib x 15 yrs. Partical hysterectomy and appendectomy. Had normal colonoscopy over 10 yrs ago. Pt anemic. INR elevated. Likely diverticular bleed, exacerbated by coumadin. Hopefully, bleeding will stop once INR goes down. Stay on liquids rest of today.  Continue to moniter hgb and INR. I will have Dr. Servando SnareWohl see patient tomorrow. Will plan on doing colonoscopy if INR stable since long time since her previous colonoscopy. Thanks.  Electronic Signatures: Lutricia Feilh, Phoenix Dresser (MD)  (Signed on 12-Sep-15 12:42)  Authored  Last Updated: 12-Sep-15 12:42 by Lutricia Feilh, Miliani Deike (MD)

## 2015-04-13 NOTE — Consult Note (Signed)
PATIENT NAME:  Jillian Kennedy, Jillian Kennedy MR#:  [id number] DATE OF BIRTH:  02/19/1925  DATE OF CONSULTATION:  09/01/2014  REFERRING PHYSICIAN:   CONSULTING PHYSICIAN:  Ezzard StandingPaul Y. Caedan Sumler, MD  REASON FOR REFERRAL: Bright red rectal bleeding.  HISTORY OF PRESENT ILLNESS: The patient is an 81 year old white female with known history of atrial fibrillation and on Coumadin. She has been on Coumadin for at least 15 years. Other history includes history of hypertension and hyperlipidemia. She apparently had at least 3 episodes of gross rectal bleeding that started last night before she came to the Emergency Room. She denied having any chest pain or palpitations, shortness of breath or coughing. She did not have any abdominal pain or cramping. She may have slight dizziness and lightheadedness. She denies any prior history of GI bleeding in the past. Her most recent colonoscopy was at least 10 years ago. She denies any history of ulcer disease. She denies taking any aspirin or NSAIDs, other than the Coumadin. Her hemoglobin is stable at 13.3.   In the Emergency Room, she was somewhat tachycardic with atrial fibrillation. Her INR was 2.4. She was ordered vitamin K and FFP then she was admitted for further evaluation. She denied taking any recent seeds or nuts. She has had a partial hysterectomy and appendectomy.   PAST MEDICAL HISTORY: History of degenerative joint disease. She has history of atrial fibrillation and right acoustic neuroma. Other history includes hypertension and anxiety. She also has a history of liver abscess that required multiple drainages. This was many years ago. Other history includes diabetes, CHF and moderate aortic stenosis.   PAST SURGICAL HISTORY: Includes hysterectomy and appendectomy. She has had right knee replacement.   ALLERGIES: Certain types of ANTI-INFLAMMATORY MEDICINES.  HOME MEDICATIONS: Include digoxin daily, Coumadin 4 mg in the evening, diltiazem daily, pravastatin 20 mg at bedtime,  Lasix daily, potassium and multivitamins.   REVIEW OF SYSTEMS: No changes from initial review of systems. Please refer to the admitting doctor's notes.   PHYSICAL EXAMINATION: GENERAL: The patient is in no acute distress. She looks comfortable.  HEAD AND NECK: Within normal limits.  CARDIAC: Irregularly irregular rhythm. LUNGS: Clear bilaterally.  ABDOMEN: Normoactive bowel sounds. It was soft and nontender throughout. He had active bowel sounds. There is no hepatosplenomegaly.  EXTREMITIES: No clubbing, cyanosis, or edema.   DIAGNOSTIC DATA: In terms of labs: This morning hemoglobin dropped from 11.7 from 13.3. Electrolytes all normal. BUN is 27. Liver enzymes are normal. INR is 2.4.   IMPRESSION AND RECOMMENDATIONS: This is a patient with lower gastrointestinal bleeding, on Coumadin. The most likely cause is diverticular bleed. Hopefully, once the Coumadin effects are reversed, the bleeding will stop on its own. However, if there is active bleeding then I would recommend a bleeding scan first to try to localize the site of bleeding. Because it has been over 10 years since her colonoscopy, I would recommend that she have a diagnostic colonoscopy scheduled. I will have Dr. Servando SnareWohl see the patient tomorrow. If her INR is stable, then we will make sure that she is prepped for a colonoscopy on Monday.   Thank you for the referral.   ____________________________ Ezzard StandingPaul Y. Bluford Kaufmannh, MD pyo:sb D: 09/03/2014 16:46:00 ET T: 09/03/2014 17:11:39 ET JOB#: [id number]  cc: Ezzard StandingPaul Y. Bluford Kaufmannh, MD, <Dictator> Ezzard StandingPAUL Y Guida Asman MD ELECTRONICALLY SIGNED 09/04/2014 9:33

## 2015-04-13 NOTE — Consult Note (Signed)
Chief Complaint:  Subjective/Chief Complaint No further bleeding since the day of admission. Bleeding scan neg. Bowel prep last night. No bleeding with prep.   VITAL SIGNS/ANCILLARY NOTES: **Vital Signs.:   14-Sep-15 05:28  Vital Signs Type Routine  Temperature Temperature (F) 97.4  Celsius 36.3  Temperature Source oral  Pulse Pulse 94  Respirations Respirations 18  Systolic BP Systolic BP 076  Diastolic BP (mmHg) Diastolic BP (mmHg) 72  Mean BP 102  Pulse Ox % Pulse Ox % 97  Pulse Ox Activity Level  At rest  Oxygen Delivery Room Air/ 21 %   Brief Assessment:  GEN no acute distress   Cardiac Irregular   Respiratory clear BS   Gastrointestinal Normal   Lab Results: Routine Chem:  13-Sep-15 03:28   Glucose, Serum  101  BUN 13  Creatinine (comp) 0.76  Sodium, Serum 143  Potassium, Serum  3.2  Chloride, Serum  110  CO2, Serum 27  Calcium (Total), Serum  8.4  Anion Gap  6  Osmolality (calc) 285  eGFR (African American) >60  eGFR (Non-African American) >60 (eGFR values <47m/min/1.73 m2 may be an indication of chronic kidney disease (CKD). Calculated eGFR is useful in patients with stable renal function. The eGFR calculation will not be reliable in acutely ill patients when serum creatinine is changing rapidly. It is not useful in  patients on dialysis. The eGFR calculation may not be applicable to patients at the low and high extremes of body sizes, pregnant women, and vegetarians.)  Magnesium, Serum  1.5 (1.8-2.4 THERAPEUTIC RANGE: 4-7 mg/dL TOXIC: > 10 mg/dL  -----------------------)  14-Sep-15 03:08   Glucose, Serum 98  BUN 8  Creatinine (comp) 0.77  Sodium, Serum 143  Potassium, Serum 3.7  Chloride, Serum 107  CO2, Serum 26  Calcium (Total), Serum 8.5  Anion Gap 10  Osmolality (calc) 283  eGFR (African American) >60  eGFR (Non-African American) >60 (eGFR values <660mmin/1.73 m2 may be an indication of chronic kidney disease (CKD). Calculated eGFR  is useful in patients with stable renal function. The eGFR calculation will not be reliable in acutely ill patients when serum creatinine is changing rapidly. It is not useful in  patients on dialysis. The eGFR calculation may not be applicable to patients at the low and high extremes of body sizes, pregnant women, and vegetarians.)  Routine Coag:  13-Sep-15 03:28   Prothrombin  15.7  INR 1.3 (INR reference interval applies to patients on anticoagulant therapy. A single INR therapeutic range for coumarins is not optimal for all indications; however, the suggested range for most indications is 2.0 - 3.0. Exceptions to the INR Reference Range may include: Prosthetic heart valves, acute myocardial infarction, prevention of myocardial infarction, and combinations of aspirin and anticoagulant. The need for a higher or lower target INR must be assessed individually. Reference: The Pharmacology and Management of the Vitamin K  antagonists: the seventh ACCP Conference on Antithrombotic and Thrombolytic Therapy. ChAUQJF.3545ept:126 (3suppl): [id number] HCT value >55% may artifactually increase the PT.  In one study,  the increase was an average of 25%. Reference:  "Effect on Routine and Special Coagulation Testing Values of Citrate Anticoagulant Adjustment in Patients with High HCT Values." American Journal of Clinical Pathology 2006;126:400-405.)  Routine Hem:  13-Sep-15 03:28   WBC (CBC) 6.2  RBC (CBC)  3.59  Hemoglobin (CBC)  11.4  Hematocrit (CBC)  34.3  Platelet Count (CBC) 160  MCV 95  MCH 31.7  MCHC 33.2  RDW 14.5  Neutrophil % 63.7  Lymphocyte % 22.7  Monocyte % 11.2  Eosinophil % 1.7  Basophil % 0.7  Neutrophil # 3.9  Lymphocyte # 1.4  Monocyte # 0.7  Eosinophil # 0.1  Basophil # 0.0 (Result(s) reported on 02 Sep 2014 at 04:35AM.)  14-Sep-15 03:08   WBC (CBC) 8.4  RBC (CBC) 3.89  Hemoglobin (CBC) 12.5  Hematocrit (CBC) 37.5  Platelet Count (CBC) 183  MCV 96  MCH  32.2  MCHC 33.5  RDW 14.1  Neutrophil % 62.6  Lymphocyte % 23.0  Monocyte % 12.5  Eosinophil % 1.2  Basophil % 0.7  Neutrophil # 5.3  Lymphocyte # 1.9  Monocyte #  1.1  Eosinophil # 0.1  Basophil # 0.1 (Result(s) reported on 03 Sep 2014 at 04:27AM.)   Radiology Results: Nuclear Med:    12-Sep-15 18:22, GI Blood Loss Study - Nuc Med  GI Blood Loss Study - Nuc Med   REASON FOR EXAM:    GI bleed  COMMENTS:       PROCEDURE: NM  - NM GI BLOOD LOSS STUDY  - Sep 01 2014  6:22PM     CLINICAL DATA:  GI bleed.    EXAM:  NUCLEAR MEDICINE GASTROINTESTINAL BLEEDING SCAN    TECHNIQUE:  Sequential abdominal images were obtained following intravenous  administration of Tc-57mlabeled red blood cells.    RADIOPHARMACEUTICALS:  21.7 mCi Tc-971mn-vitro labeled red cells.  COMPARISON:  None.    FINDINGS:  No active GI bleed is identified over a 60 min time period.     IMPRESSION:  Negative nuclear medicine bleeding scan.      Electronically Signed    By: MaKalman Jewels.D.    On: 09/01/2014 18:30         Verified By: PEMarlane HatcherM.D.,   Assessment/Plan:  Assessment/Plan:  Assessment LGI bleeding. Neg bleeding scan. Hgb stable.   Plan For colonoscopy this AM.   Electronic Signatures: OhVerdie ShireMD)  (Signed 14-Sep-15 09:49)  Authored: Chief Complaint, VITAL SIGNS/ANCILLARY NOTES, Brief Assessment, Lab Results, Radiology Results, Assessment/Plan   Last Updated: 14-Sep-15 09:49 by OhVerdie ShireMD)

## 2015-04-13 NOTE — Consult Note (Signed)
No further bleeding. Colonoscopy showed diffuse tics throughout the colon. No active bleeding. Pt most likely had diverticular bleeding. Full liquid diet. Advance to low residue diet if tolerated without rebleed. If no further bleeding off coumadin, can be discharged by tomorrow. Thanks.  Electronic Signatures: Lutricia Feilh, Zakyria Metzinger (MD)  (Signed on 14-Sep-15 12:48)  Authored  Last Updated: 14-Sep-15 12:48 by Lutricia Feilh, Marchele Decock (MD)

## 2015-04-13 NOTE — Consult Note (Signed)
Chief Complaint:  Subjective/Chief Complaint Pt denies any bleeding or palpitations. She is still off of coumadin and ready to go to Rehab.   VITAL SIGNS/ANCILLARY NOTES: **Vital Signs.:   16-Sep-15 14:30  Vital Signs Type Routine  Temperature Temperature (F) 99.6  Celsius 37.5  Temperature Source oral  Pulse Pulse 113  Respirations Respirations 19  Systolic BP Systolic BP 130  Diastolic BP (mmHg) Diastolic BP (mmHg) 65  Mean BP 86  Pulse Ox % Pulse Ox % 95  Pulse Ox Activity Level  At rest  Oxygen Delivery Room Air/ 21 %  *Intake and Output.:   Shift 16-Sep-15 15:00  Grand Totals Intake:  360 Output:      Net:  360 24 Hr.:  360  Oral Intake      In:  360  Urine ml     Out:  0  Length of Stay Totals Intake:  6377 Output:      Net:  6377   Brief Assessment:  GEN well developed, well nourished, no acute distress   Cardiac Irregular  murmur present   Respiratory normal resp effort  clear BS   Gastrointestinal Normal   Gastrointestinal details normal Soft  Nontender  Nondistended   EXTR negative cyanosis/clubbing   Lab Results: Routine Hem:  15-Sep-15 05:41   WBC (CBC)  12.8  RBC (CBC)  3.74  Hemoglobin (CBC)  11.6  Hematocrit (CBC) 35.9  Platelet Count (CBC) 185  MCV 96  MCH 31.0  MCHC 32.4  RDW 14.0  Neutrophil % 68.9  Lymphocyte % 17.9  Monocyte % 12.6  Eosinophil % 0.3  Basophil % 0.3  Neutrophil #  8.8  Lymphocyte # 2.3  Monocyte #  1.6  Eosinophil # 0.0  Basophil # 0.0 (Result(s) reported on 04 Sep 2014 at 06:56AM.)  16-Sep-15 12:08   WBC (CBC)  13.1  RBC (CBC) 3.94  Hemoglobin (CBC) 12.3  Hematocrit (CBC) 38.0  Platelet Count (CBC) 180  MCV 97  MCH 31.4  MCHC 32.5  RDW 14.3  Neutrophil % 66.5  Lymphocyte % 18.7  Monocyte % 13.2  Eosinophil % 0.8  Basophil % 0.8  Neutrophil #  8.7  Lymphocyte # 2.4  Monocyte #  1.7  Eosinophil # 0.1  Basophil # 0.1 (Result(s) reported on 05 Sep 2014 at 12:30PM.)   Radiology Results: XRay:   12-Sep-15 05:16, Chest Portable Single View  Chest Portable Single View   REASON FOR EXAM:    GI BLEED, RHONCHI  COMMENTS:       PROCEDURE: DXR - DXR PORTABLE CHEST SINGLE VIEW  - Sep 01 2014  5:16AM     CLINICAL DATA:  Rhonchi.  GI bleeding.    EXAM:  PORTABLE CHEST - 1 VIEW    COMPARISON:  None.    FINDINGS:  The lungs are well-aerated. Mild right basilar airspace opacity may  reflect pneumonia. There is no evidence of pleural effusion or  pneumothorax.    The cardiomediastinal silhouette is borderline normal in size. No  acute osseous abnormalities are seen.     IMPRESSION:  Mild right basilar airspace opacity may reflect pneumonia.      Electronically Signed    By: Roanna RaiderJeffery  Chang M.D.    On: 09/01/2014 05:45         Verified By: JEFFREY . CHANG, M.D.,    15-Sep-15 18:06, Chest 1 View AP or PA  Chest 1 View AP or PA   REASON FOR EXAM:    rule out  pnemonia  COMMENTS:       PROCEDURE: DXR - DXR CHEST 1 VIEWAP OR PA  - Sep 04 2014  6:06PM     CLINICAL DATA:  Cough.  Shortness of breath.    EXAM:  CHEST - 1 VIEW    COMPARISON:  09/01/2014    FINDINGS:  Mild cardiomegaly with diffuse interstitial and patchy airspace  opacities in both lungs, increased from prior. Atherosclerotic  aortic arch. Mild blunting of the left lateral costophrenic angle.     IMPRESSION:  1. Worsened bilateral interstitial opacities with some patchy  airspace opacities in both lungs. I favor acute pulmonary edema  given the underlying cardiomegaly, although atypical pneumonia can  have a similar appearance.  2. Suspected small left pleural effusion.  3. Atherosclerotic aortic arch.      Electronically Signed    By: Herbie Baltimore M.D.    On: 09/04/2014 21:58     Verified By: Dellia Cloud, M.D.,  Cardiology:    12-Sep-15 04:05, ECG  Ventricular Rate 100  Atrial Rate 55  QRS Duration 92  QT 362  QTc 466  R Axis -32  T Axis 24  ECG interpretation   Undetermined  rhythm  Left axis deviation  Incomplete right bundle branch block  Septal infarct , age undetermined  Abnormal ECG  When compared with ECG of 11-Mar-2002 08:57,  Current undetermined rhythm precludes rhythm comparison, needs review  Incomplete right bundle branch block is now Present  Septal infarct is now Present  Nonspecific T wave abnormality now evident in Anterior leads  ----------unconfirmed----------  Confirmed by OVERREAD, NOT ([id number]), editor PEARSON, BARBARA ([id number]) on 09/03/2014 8:58:29 AM  ECG   Nuclear Med:    12-Sep-15 18:22, GI Blood Loss Study - Nuc Med  GI Blood Loss Study - Nuc Med   REASON FOR EXAM:    GI bleed  COMMENTS:       PROCEDURE: NM  - NM GI BLOOD LOSS STUDY  - Sep 01 2014  6:22PM     CLINICAL DATA:  GI bleed.    EXAM:  NUCLEAR MEDICINE GASTROINTESTINAL BLEEDING SCAN    TECHNIQUE:  Sequential abdominal images were obtained following intravenous  administration of Tc-69m labeled red blood cells.    RADIOPHARMACEUTICALS:  21.7 mCi Tc-55m in-vitro labeled red cells.  COMPARISON:  None.    FINDINGS:  No active GI bleed is identified over a 60 min time period.     IMPRESSION:  Negative nuclear medicine bleeding scan.      Electronically Signed    By: Loralie Champagne M.D.    On: 09/01/2014 18:30         Verified By: Cristi Loron, M.D.,   Assessment/Plan:  Assessment/Plan:  Assessment IMP LGI bleeding Anemia AFIB Obesity HTN Dementia Weakness .   Plan PLAN Continue to hold coumadin for at least 2-4 weeks Continue to f/u on H/H Change diet to to improved diverticulosis Rec weight loss exericse when able Agree with rehab to regain strenght Consider further evatution for dementia and memory Continue rate control for AFIB F/U cardiology 2-3 week before restarting coumadin   Electronic Signatures: Dorothyann Peng D (MD)  (Signed 16-Sep-15 18:01)  Authored: Chief Complaint, VITAL SIGNS/ANCILLARY NOTES, Brief Assessment, Lab  Results, Radiology Results, Assessment/Plan   Last Updated: 16-Sep-15 18:01 by Dorothyann Peng D (MD)

## 2015-10-11 IMAGING — CR DG CHEST 1V
1 series · 1 of 1 positions shown · non-contrast
Comparison: 09/01/2014

CLINICAL DATA: Cough.  Shortness of breath.

EXAM:
CHEST - 1 VIEW

[pa]
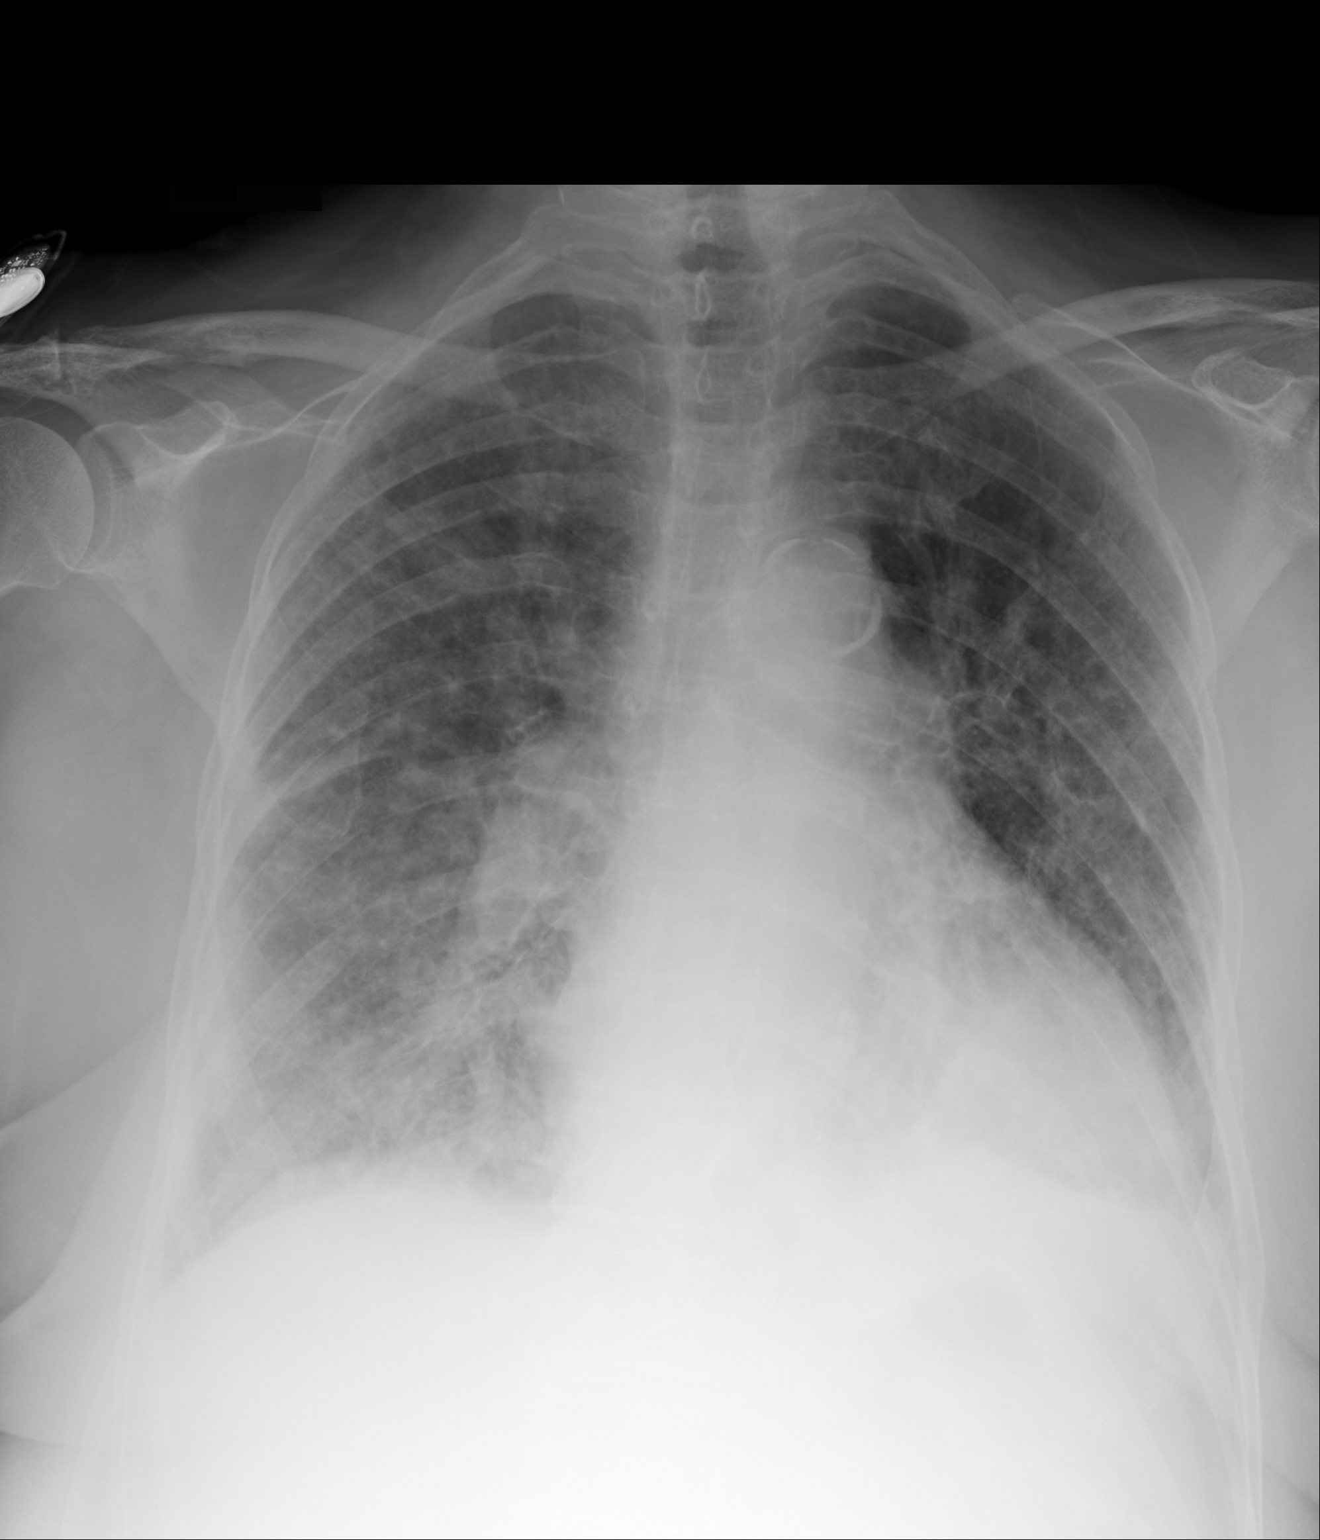

[1 of 1 positions shown; findings below may reference images not displayed]

FINDINGS: Mild cardiomegaly with diffuse interstitial and patchy airspace
opacities in both lungs, increased from prior. Atherosclerotic
aortic arch. Mild blunting of the left lateral costophrenic angle.
IMPRESSION: 1. Worsened bilateral interstitial opacities with some patchy
airspace opacities in both lungs. I favor acute pulmonary edema
given the underlying cardiomegaly, although atypical pneumonia can
have a similar appearance.
2. Suspected small left pleural effusion.
3. Atherosclerotic aortic arch.

## 2015-10-19 IMAGING — CR DG CHEST 1V PORT
1 series · 1 of 1 positions shown · non-contrast
Comparison: September 04, 2014

CLINICAL DATA: Hypoxia

EXAM:
PORTABLE CHEST - 1 VIEW

[AP]
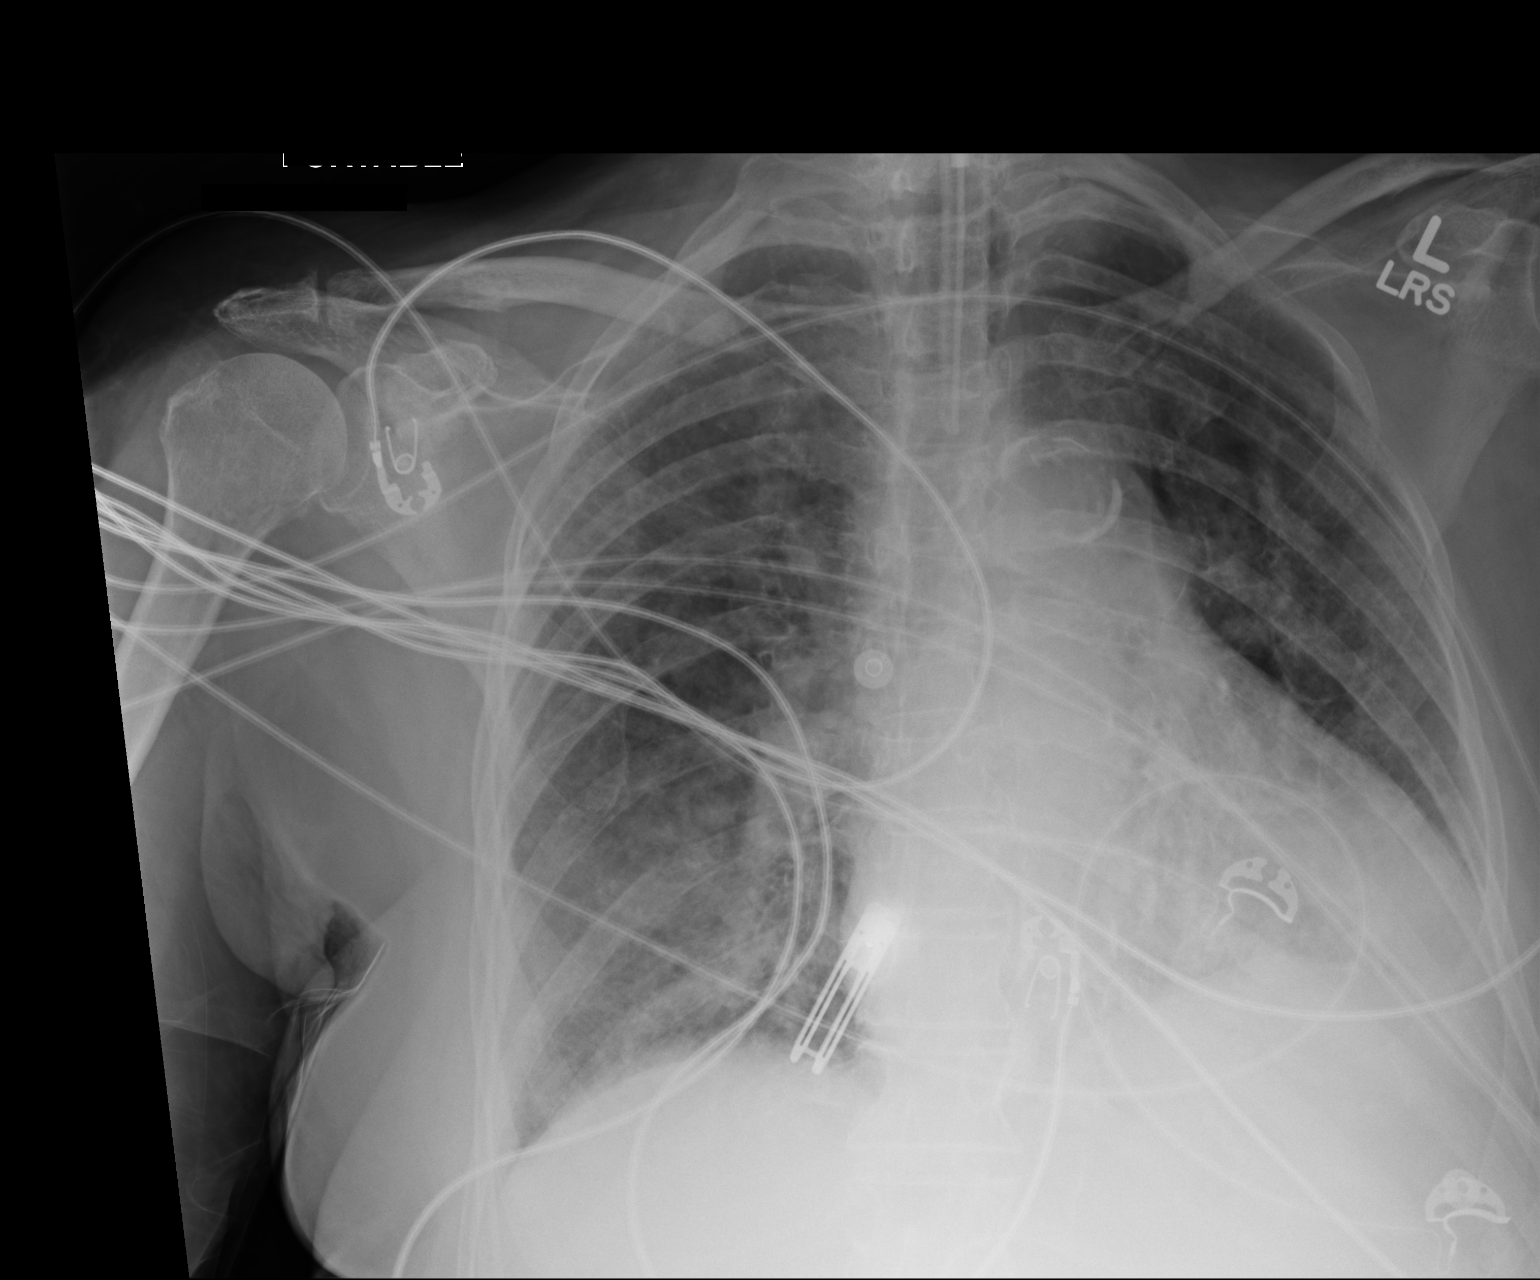

[1 of 1 positions shown; findings below may reference images not displayed]

FINDINGS: Endotracheal tube tip is 5.2 cm above the carina. No pneumothorax.
There is mild interstitial edema. There is no airspace
consolidation. Heart is mildly enlarged. The pulmonary vascularity
is within normal limits. No adenopathy. There is atherosclerotic
change in aorta. No bone lesions.
IMPRESSION: Endotracheal tube as described without pneumothorax. Cardiomegaly
with mild interstitial edema. Suspect a degree of congestive heart
failure. No airspace consolidation.

## 2015-10-19 IMAGING — CT CT HEAD W/O CM
1 series · 15 of 30 positions shown, 19 images · non-contrast
Comparison: None.

CLINICAL DATA: Code stroke. Left-sided weakness and facial droop.
Slurred speech.

EXAM:
CT HEAD WITHOUT CONTRAST
TECHNIQUE: Contiguous axial images were obtained from the base of the skull
through the vertex without intravenous contrast.

[Series 2: head 5.0 h30s · axial · 0.45mm/px · z∈[-159,-4]mm · 15 of 35 slices shown, 19 images]
[im 2/35  brain]
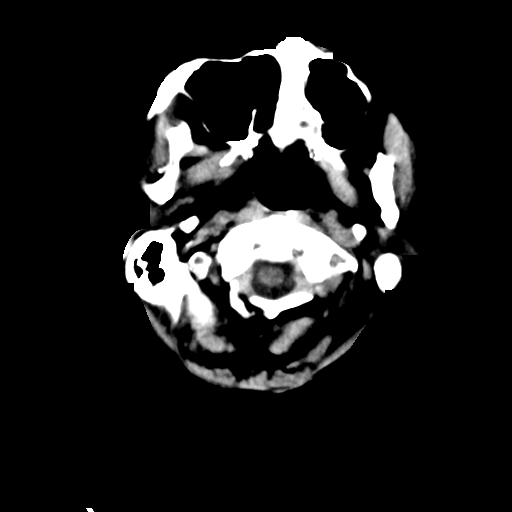
[im 2/35  bone]
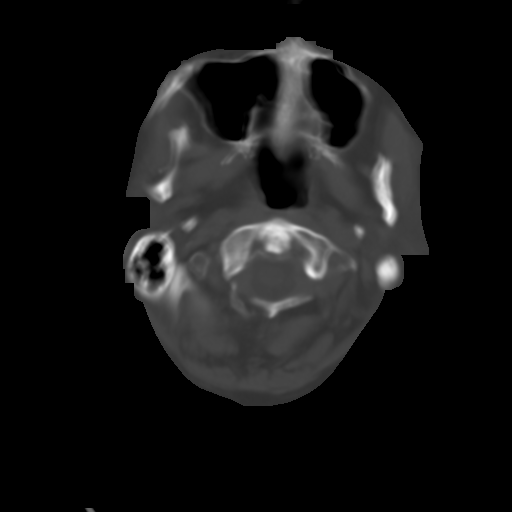
[im 4/35  brain]
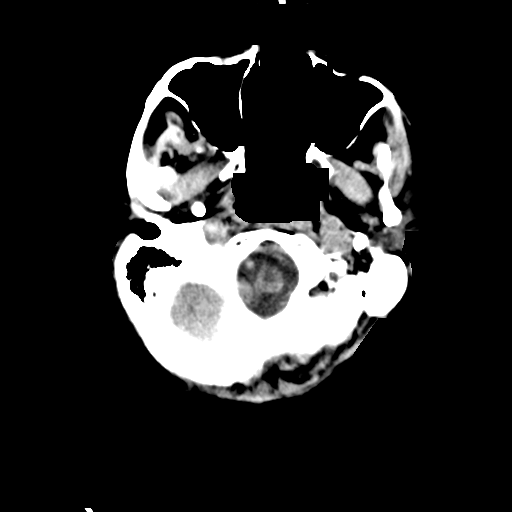
[im 6/35  brain]
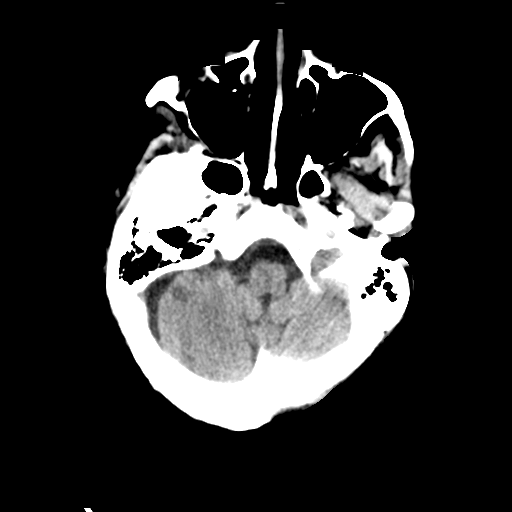
[im 9/35  brain]
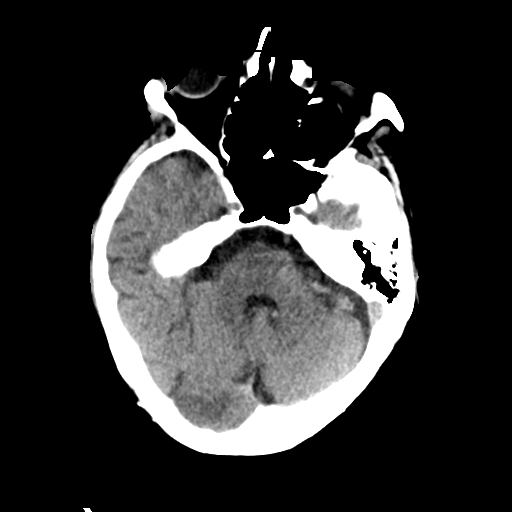
[im 11/35  brain]
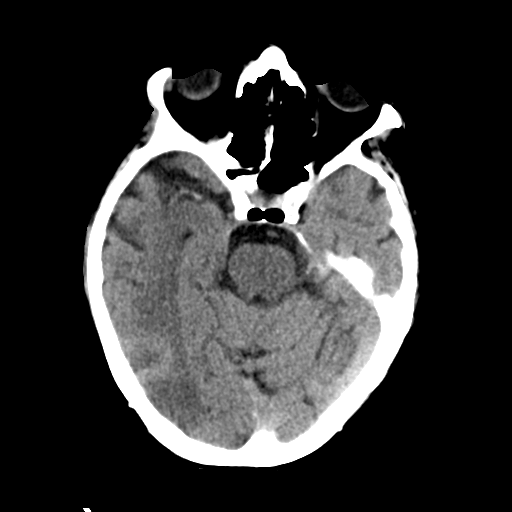
[im 11/35  bone]
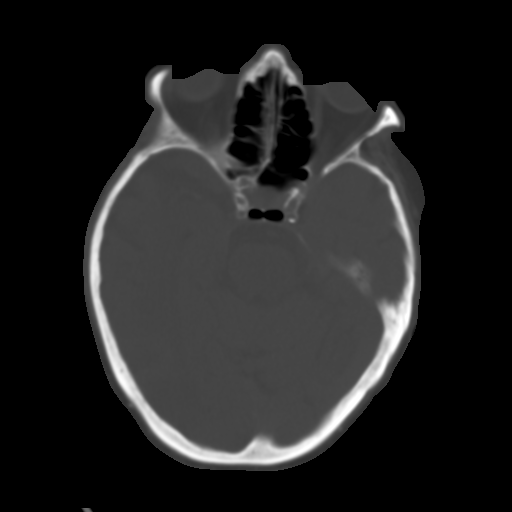
[im 13/35  brain]
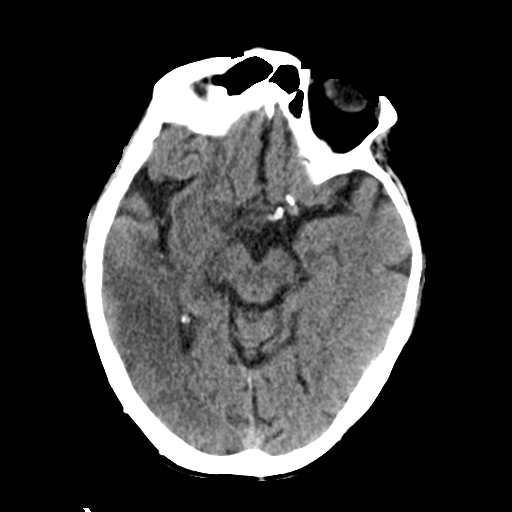
[im 16/35  brain]
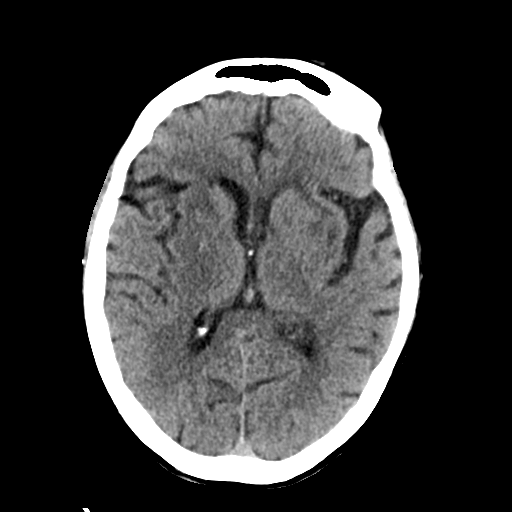
[im 18/35  brain]
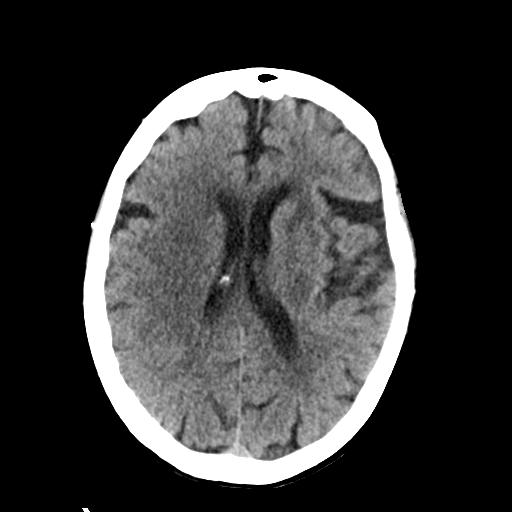
[im 19/35  brain]
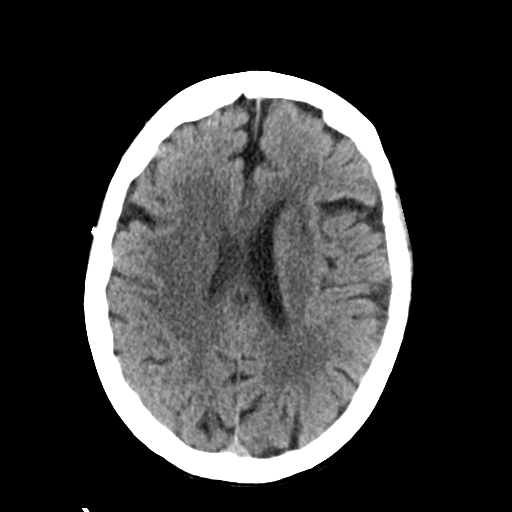
[im 19/35  bone]
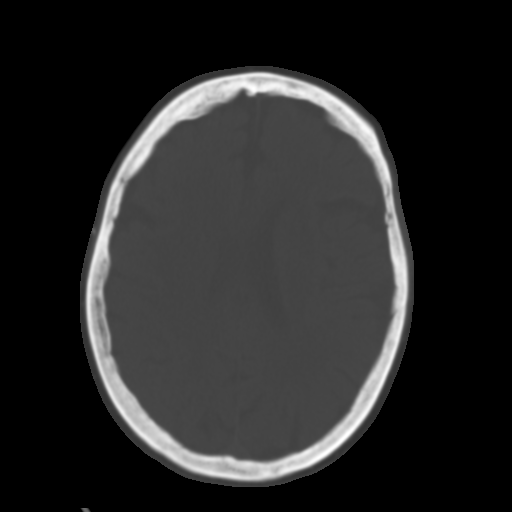
[im 22/35  brain]
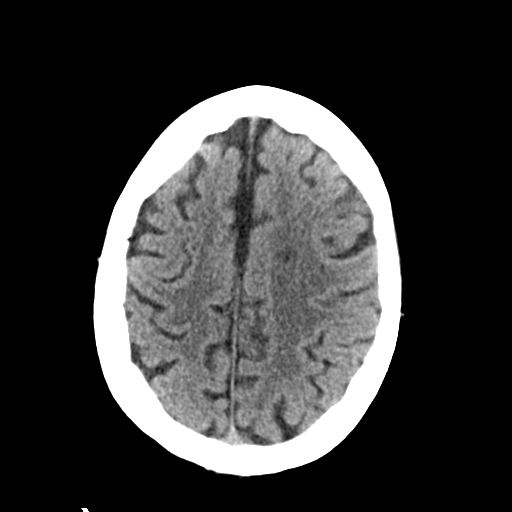
[im 24/35  brain]
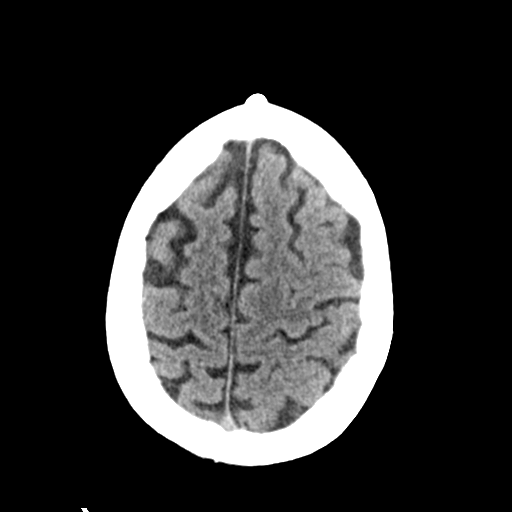
[im 26/35  brain]
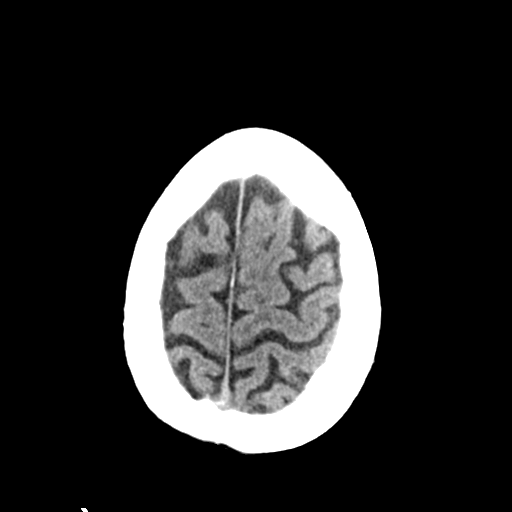
[im 29/35  brain]
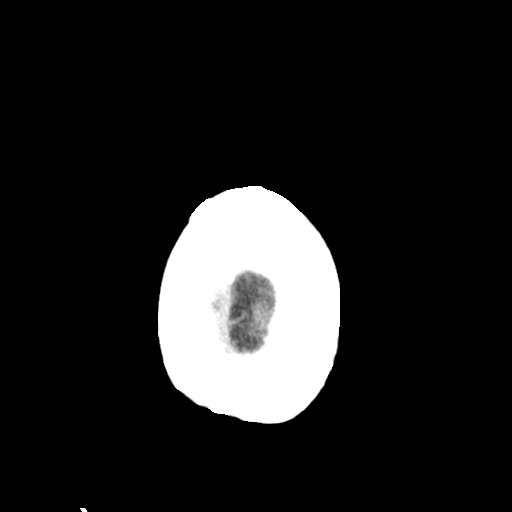
[im 29/35  bone]
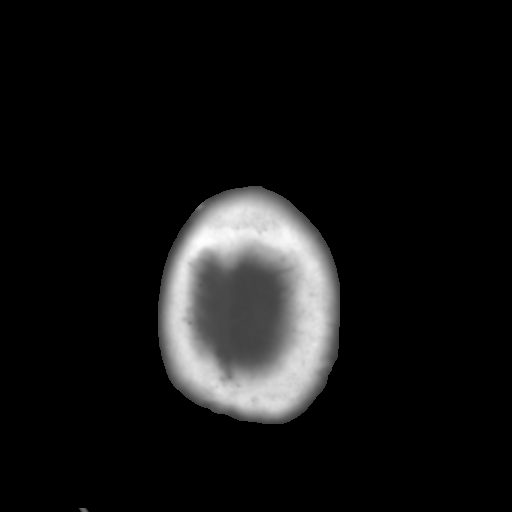
[im 31/35  brain]
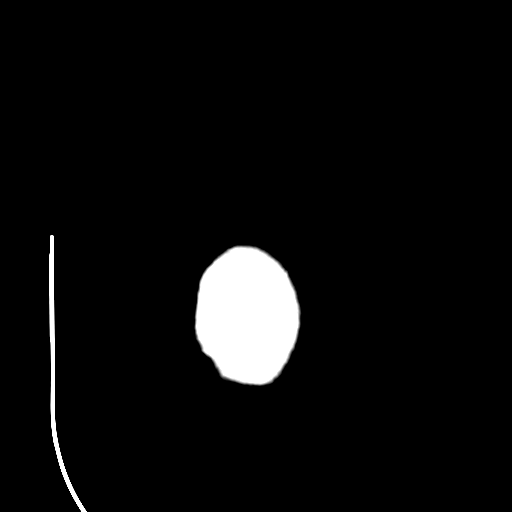
[im 33/35  brain]
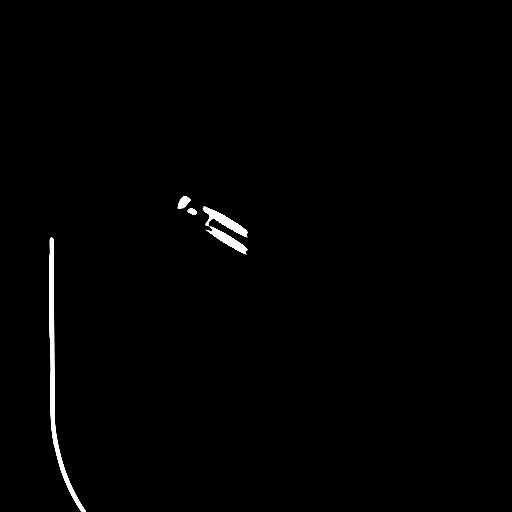

[15 of 30 positions shown; findings below may reference images not displayed]

FINDINGS: No acute osseous findings. There is a small incidental osteoma from
the central frontal bone. Dermal inclusion cyst in the high right
frontal scalp.

There is cytotoxic edema involving the right temporal operculum,
posterior temporal lobe, and extending towards the inferior right
occipital lobe. The visible infarct may not be third of the
territory, but is still extensive. Abnormal high attenuation in the
region of the right M1 segment. There may be blurring of the upper
right putamen. No evidence of hemorrhage.

Mild periventricular white matter disease.

Cerebral volume loss which is age appropriate.

Critical Value/emergent results were called by telephone at the time
of interpretation on 09/12/2014 at [DATE] to Dr. THEBANGKOK MABALEKA , who
verbally acknowledged these results.
IMPRESSION: 1. Probable right M1 thrombus.
2. Visible infarct in posterior right MCA territory, inferior
division. There is extension into the inferior right occipital lobe,
likely posterior watershed.
